# Patient Record
Sex: Male | Born: 1985 | Race: White | Hispanic: No | Marital: Single | Smoking: Current every day smoker
Health system: Southern US, Community
[De-identification: ages and names within clinical notes are randomized; demographics above are authoritative.]

## PROBLEM LIST (undated history)

## (undated) DIAGNOSIS — F32A Depression, unspecified: Secondary | ICD-10-CM

## (undated) DIAGNOSIS — F329 Major depressive disorder, single episode, unspecified: Secondary | ICD-10-CM

## (undated) DIAGNOSIS — B019 Varicella without complication: Secondary | ICD-10-CM

## (undated) DIAGNOSIS — F419 Anxiety disorder, unspecified: Secondary | ICD-10-CM

## (undated) HISTORY — DX: Varicella without complication: B01.9

## (undated) HISTORY — DX: Anxiety disorder, unspecified: F41.9

## (undated) HISTORY — DX: Major depressive disorder, single episode, unspecified: F32.9

## (undated) HISTORY — DX: Depression, unspecified: F32.A

## (undated) HISTORY — PX: OTHER SURGICAL HISTORY: SHX169

---

## 2013-02-27 ENCOUNTER — Ambulatory Visit: Payer: Self-pay | Admitting: Physician Assistant

## 2013-03-27 ENCOUNTER — Encounter: Payer: Self-pay | Admitting: Physician Assistant

## 2013-03-27 ENCOUNTER — Ambulatory Visit (INDEPENDENT_AMBULATORY_CARE_PROVIDER_SITE_OTHER): Payer: 59 | Admitting: Physician Assistant

## 2013-03-27 VITALS — BP 122/86 | HR 81 | Temp 98.0°F | Resp 16 | Ht 69.0 in | Wt 169.8 lb

## 2013-03-27 DIAGNOSIS — Z299 Encounter for prophylactic measures, unspecified: Secondary | ICD-10-CM | POA: Insufficient documentation

## 2013-03-27 DIAGNOSIS — F411 Generalized anxiety disorder: Secondary | ICD-10-CM | POA: Insufficient documentation

## 2013-03-27 DIAGNOSIS — Z0000 Encounter for general adult medical examination without abnormal findings: Secondary | ICD-10-CM

## 2013-03-27 DIAGNOSIS — F338 Other recurrent depressive disorders: Secondary | ICD-10-CM | POA: Insufficient documentation

## 2013-03-27 DIAGNOSIS — F39 Unspecified mood [affective] disorder: Secondary | ICD-10-CM

## 2013-03-27 LAB — CBC WITH DIFFERENTIAL/PLATELET
BASOS ABS: 0 10*3/uL (ref 0.0–0.1)
BASOS PCT: 0 % (ref 0–1)
EOS ABS: 0.1 10*3/uL (ref 0.0–0.7)
Eosinophils Relative: 2 % (ref 0–5)
HEMATOCRIT: 43.5 % (ref 39.0–52.0)
Hemoglobin: 15.3 g/dL (ref 13.0–17.0)
Lymphocytes Relative: 37 % (ref 12–46)
Lymphs Abs: 1.6 10*3/uL (ref 0.7–4.0)
MCH: 33.3 pg (ref 26.0–34.0)
MCHC: 35.2 g/dL (ref 30.0–36.0)
MCV: 94.8 fL (ref 78.0–100.0)
MONOS PCT: 15 % — AB (ref 3–12)
Monocytes Absolute: 0.6 10*3/uL (ref 0.1–1.0)
Neutro Abs: 2 10*3/uL (ref 1.7–7.7)
Neutrophils Relative %: 46 % (ref 43–77)
Platelets: 213 10*3/uL (ref 150–400)
RBC: 4.59 MIL/uL (ref 4.22–5.81)
RDW: 13.5 % (ref 11.5–15.5)
WBC: 4.2 10*3/uL (ref 4.0–10.5)

## 2013-03-27 MED ORDER — ESCITALOPRAM OXALATE 10 MG PO TABS: 10.0000 mg | ORAL_TABLET | Freq: Every day | ORAL | Status: DC

## 2013-03-27 MED ORDER — DIAZEPAM 2 MG PO TABS: 2.0000 mg | ORAL_TABLET | Freq: Two times a day (BID) | ORAL | Status: DC | PRN

## 2013-03-27 NOTE — Assessment & Plan Note (Signed)
Encouraged outdoor activities and exercise.  Will resume Lexapro for GAD. Follow-up in 1 month.

## 2013-03-27 NOTE — Progress Notes (Signed)
Pre visit review using our clinic review tool, if applicable. No additional management support is needed unless otherwise documented below in the visit note/SLS  

## 2013-03-27 NOTE — Assessment & Plan Note (Signed)
Medical history reviewed.  Patient UTD on health maintenance parameters.  Will obtain fasting labs today.

## 2013-03-27 NOTE — Progress Notes (Signed)
Patient presents to clinic today to establish care.  Acute Concerns: Wants to discuss medications for anxiety (see below).  Patient also requesting yearly STD screening.  Denies current concerns for STD. Patient is sexually active with one male partner, engaging in receptive and penetrative anal sex.  Endorses consistent condom use. Denies hx of STD.   Chronic Issues: Anxiety -- Patient endorses history of generalized anxiety.  Has been on Lexapro before, with good control of symptoms.  Stopped medication due to lack of insurance. Patient endorses continued symptoms.  Denies panic attacks.  Endorses seasonal affective disorder including symptpoms of depressed mood.  Denies suicidal thought or ideation.  Denies substance abuse.  Seasonal Affective Disorder -- endorses winter symptoms of depressed mood and anhedonia. Denies change in sleep or appetite.  Denies suicidal thought or ideation.  Would like to resume Lexapro for his generalized anxiety disorder.  Health Maintenance: Dental -- UTD Vision -- overude Immunizations -- Tetanus shot 2014.  Flu shot in November 2014.  Past Medical History  Diagnosis Date  . Chicken pox   . Anxiety   . Depression     Past Surgical History  Procedure Laterality Date  . Unremarkable.      No current outpatient prescriptions on file prior to visit.   No current facility-administered medications on file prior to visit.    Allergies  Allergen Reactions  . Skelaxin [Metaxalone] Swelling and Rash    Family History  Problem Relation Age of Onset  . Adopted: Yes  . Other      Adopted    History   Social History  . Marital Status: Single    Spouse Name: N/A    Number of Children: N/A  . Years of Education: N/A   Occupational History  . Not on file.   Social History Main Topics  . Smoking status: Current Every Day Smoker -- 0.25 packs/day for 10 years    Types: Cigarettes  . Smokeless tobacco: Never Used  . Alcohol Use: 7.5 oz/week    5 Glasses of wine, 5 Cans of beer, 3 Drinks containing 0.5 oz of alcohol per week  . Drug Use: No  . Sexual Activity: Yes    Birth Control/ Protection: Condom     Comment: men    Other Topics Concern  . Not on file   Social History Narrative  . No narrative on file   Review of Systems  Constitutional: Negative for fever and malaise/fatigue.  HENT: Negative for ear discharge, ear pain, hearing loss and tinnitus.   Eyes: Negative for blurred vision, double vision and photophobia.  Respiratory: Negative for cough and shortness of breath.   Cardiovascular: Negative for chest pain and palpitations.  Gastrointestinal: Negative for heartburn, nausea, vomiting, abdominal pain, diarrhea, constipation, blood in stool and melena.  Genitourinary: Negative for dysuria, urgency, frequency, hematuria and flank pain.       No erectile dysfunction.  Nocturia x 1  Neurological: Negative for dizziness, seizures, loss of consciousness and headaches.  Endo/Heme/Allergies: Negative for environmental allergies.  Psychiatric/Behavioral: Positive for depression. Negative for suicidal ideas, hallucinations and substance abuse. The patient is nervous/anxious. The patient does not have insomnia.     BP 122/86  Pulse 81  Temp(Src) 98 F (36.7 C) (Oral)  Resp 16  Ht 5\' 9"  (1.753 m)  Wt 169 lb 12 oz (76.998 kg)  BMI 25.06 kg/m2  SpO2 98%  Physical Exam  Constitutional: He is oriented to person, place, and time and well-developed, well-nourished, and  in no distress.  HENT:  Head: Normocephalic and atraumatic.  Right Ear: External ear normal.  Left Ear: External ear normal.  Nose: Nose normal.  Mouth/Throat: Oropharynx is clear and moist. No oropharyngeal exudate.  TM within normal limits bilaterally.  Eyes: Conjunctivae and EOM are normal. Pupils are equal, round, and reactive to light.  Neck: Neck supple. No thyromegaly present.  Cardiovascular: Normal rate, regular rhythm, normal heart sounds and  intact distal pulses.   Pulmonary/Chest: Effort normal and breath sounds normal. No respiratory distress. He has no wheezes. He has no rales. He exhibits no tenderness.  Abdominal: Soft. Bowel sounds are normal. He exhibits no distension and no mass. There is no tenderness. There is no rebound and no guarding. No hernia.  Genitourinary: Prostate normal, testes/scrotum normal and penis normal. No discharge found.  Musculoskeletal: Normal range of motion.  Lymphadenopathy:    He has no cervical adenopathy.       Right: No inguinal adenopathy present.       Left: No inguinal adenopathy present.  Neurological: He is alert and oriented to person, place, and time. No cranial nerve deficit.  Skin: Skin is warm and dry. No rash noted.  Psychiatric: Affect normal.    No results found for this or any previous visit (from the past 2160 hour(s)).  Assessment/Plan: No problem-specific assessment & plan notes found for this encounter.

## 2013-03-27 NOTE — Patient Instructions (Signed)
Please obtain labs.  I will call you with your results.  Please start Lexapro -- 1/2 tablet daily for 7 days.  Then increase to 1 tablet daily.  Will follow-up in 1 month.  Please read information below on smoking cessation options.  Please call or return to clinic sooner if you need anything.  Smoking Cessation Quitting smoking is important to your health and has many advantages. However, it is not always easy to quit since nicotine is a very addictive drug. Often times, people try 3 times or more before being able to quit. This document explains the best ways for you to prepare to quit smoking. Quitting takes hard work and a lot of effort, but you can do it. ADVANTAGES OF QUITTING SMOKING  You will live longer, feel better, and live better.  Your body will feel the impact of quitting smoking almost immediately.  Within 20 minutes, blood pressure decreases. Your pulse returns to its normal level.  After 8 hours, carbon monoxide levels in the blood return to normal. Your oxygen level increases.  After 24 hours, the chance of having a heart attack starts to decrease. Your breath, hair, and body stop smelling like smoke.  After 48 hours, damaged nerve endings begin to recover. Your sense of taste and smell improve.  After 72 hours, the body is virtually free of nicotine. Your bronchial tubes relax and breathing becomes easier.  After 2 to 12 weeks, lungs can hold more air. Exercise becomes easier and circulation improves.  The risk of having a heart attack, stroke, cancer, or lung disease is greatly reduced.  After 1 year, the risk of coronary heart disease is cut in half.  After 5 years, the risk of stroke falls to the same as a nonsmoker.  After 10 years, the risk of lung cancer is cut in half and the risk of other cancers decreases significantly.  After 15 years, the risk of coronary heart disease drops, usually to the level of a nonsmoker.  If you are pregnant, quitting smoking will  improve your chances of having a healthy baby.  The people you live with, especially any children, will be healthier.  You will have extra money to spend on things other than cigarettes. QUESTIONS TO THINK ABOUT BEFORE ATTEMPTING TO QUIT You may want to talk about your answers with your caregiver.  Why do you want to quit?  If you tried to quit in the past, what helped and what did not?  What will be the most difficult situations for you after you quit? How will you plan to handle them?  Who can help you through the tough times? Your family? Friends? A caregiver?  What pleasures do you get from smoking? What ways can you still get pleasure if you quit? Here are some questions to ask your caregiver:  How can you help me to be successful at quitting?  What medicine do you think would be best for me and how should I take it?  What should I do if I need more help?  What is smoking withdrawal like? How can I get information on withdrawal? GET READY  Set a quit date.  Change your environment by getting rid of all cigarettes, ashtrays, matches, and lighters in your home, car, or work. Do not let people smoke in your home.  Review your past attempts to quit. Think about what worked and what did not. GET SUPPORT AND ENCOURAGEMENT You have a better chance of being successful if you  have help. You can get support in many ways.  Tell your family, friends, and co-workers that you are going to quit and need their support. Ask them not to smoke around you.  Get individual, group, or telephone counseling and support. Programs are available at [employer] and health centers. Call your local health department for information about programs in your area.  Spiritual beliefs and practices may help some smokers quit.  Download a "quit meter" on your computer to keep track of quit statistics, such as how long you have gone without smoking, cigarettes not smoked, and money saved.  Get a  self-help book about quitting smoking and staying off of tobacco. LEARN NEW SKILLS AND BEHAVIORS  Distract yourself from urges to smoke. Talk to someone, go for a walk, or occupy your time with a task.  Change your normal routine. Take a different route to work. Drink tea instead of coffee. Eat breakfast in a different place.  Reduce your stress. Take a hot bath, exercise, or read a book.  Plan something enjoyable to do every day. Reward yourself for not smoking.  Explore interactive web-based programs that specialize in helping you quit. GET MEDICINE AND USE IT CORRECTLY Medicines can help you stop smoking and decrease the urge to smoke. Combining medicine with the above behavioral methods and support can greatly increase your chances of successfully quitting smoking.  Nicotine replacement therapy helps deliver nicotine to your body without the negative effects and risks of smoking. Nicotine replacement therapy includes nicotine gum, lozenges, inhalers, nasal sprays, and skin patches. Some may be available over-the-counter and others require a prescription.  Antidepressant medicine helps people abstain from smoking, but how this works is unknown. This medicine is available by prescription.  Nicotinic receptor partial agonist medicine simulates the effect of nicotine in your brain. This medicine is available by prescription. Ask your caregiver for advice about which medicines to use and how to use them based on your health history. Your caregiver will tell you what side effects to look out for if you choose to be on a medicine or therapy. Carefully read the information on the package. Do not use any other product containing nicotine while using a nicotine replacement product.  RELAPSE OR DIFFICULT SITUATIONS Most relapses occur within the first 3 months after quitting. Do not be discouraged if you start smoking again. Remember, most people try several times before finally quitting. You may have  symptoms of withdrawal because your body is used to nicotine. You may crave cigarettes, be irritable, feel very hungry, cough often, get headaches, or have difficulty concentrating. The withdrawal symptoms are only temporary. They are strongest when you first quit, but they will go away within 10 14 days. To reduce the chances of relapse, try to:  Avoid drinking alcohol. Drinking lowers your chances of successfully quitting.  Reduce the amount of caffeine you consume. Once you quit smoking, the amount of caffeine in your body increases and can give you symptoms, such as a rapid heartbeat, sweating, and anxiety.  Avoid smokers because they can make you want to smoke.  Do not let weight gain distract you. Many smokers will gain weight when they quit, usually less than 10 pounds. Eat a healthy diet and stay active. You can always lose the weight gained after you quit.  Find ways to improve your mood other than smoking. FOR MORE INFORMATION  www.smokefree.gov  Document Released: 02/10/2001 Document Revised: 08/18/2011 Document Reviewed: 05/28/2011 [facility] Patient Information 2014 [place], [place].

## 2013-03-27 NOTE — Assessment & Plan Note (Signed)
Will resume Lexapro -- 5 mg QD x 7 days, increasing to 10 mg QD thereafter.  Will also Rx Valium BID PRN until Lexapro reaches therapeutic dosage.  Will obtain BMP.  Return in 1 month or sooner if needed.

## 2013-03-27 NOTE — Assessment & Plan Note (Signed)
Will obtain STD screening labs.  Discussed increased risk of hepatitis A, syphilis and HIV among the MSM population. Patient is sexually active with one partner, endorsing consistent condom usage.

## 2013-03-28 LAB — COMPLETE METABOLIC PANEL WITH GFR
ALK PHOS: 82 U/L (ref 39–117)
ALT: 21 U/L (ref 0–53)
AST: 19 U/L (ref 0–37)
Albumin: 4.8 g/dL (ref 3.5–5.2)
BUN: 13 mg/dL (ref 6–23)
CO2: 29 mEq/L (ref 19–32)
CREATININE: 0.9 mg/dL (ref 0.50–1.35)
Calcium: 9.6 mg/dL (ref 8.4–10.5)
Chloride: 99 mEq/L (ref 96–112)
GFR, Est African American: >89 mL/min
GFR, Est Non African American: >89 mL/min
Glucose, Bld: 83 mg/dL (ref 70–99)
Potassium: 4 mEq/L (ref 3.5–5.3)
Sodium: 136 mEq/L (ref 135–145)
Total Bilirubin: 1 mg/dL (ref 0.3–1.2)
Total Protein: 7.7 g/dL (ref 6.0–8.3)

## 2013-03-28 LAB — URINALYSIS, ROUTINE W REFLEX MICROSCOPIC
Bilirubin Urine: NEGATIVE
Glucose, UA: NEGATIVE mg/dL
HGB URINE DIPSTICK: NEGATIVE
Ketones, ur: NEGATIVE mg/dL
NITRITE: NEGATIVE
PH: 7 (ref 5.0–8.0)
Protein, ur: NEGATIVE mg/dL
SPECIFIC GRAVITY, URINE: 1.021 (ref 1.005–1.030)
UROBILINOGEN UA: 1 mg/dL (ref 0.0–1.0)

## 2013-03-28 LAB — URINALYSIS, MICROSCOPIC ONLY
Bacteria, UA: NONE SEEN
CASTS: NONE SEEN
Crystals: NONE SEEN
Squamous Epithelial / LPF: NONE SEEN

## 2013-03-28 LAB — RPR

## 2013-03-28 LAB — TSH: TSH: 1.317 u[IU]/mL (ref 0.350–4.500)

## 2013-03-28 LAB — LIPID PANEL
CHOL/HDL RATIO: 2.4 ratio
Cholesterol: 149 mg/dL (ref 0–200)
HDL: 62 mg/dL (ref >39)
LDL Cholesterol: 68 mg/dL (ref 0–99)
Triglycerides: 96 mg/dL (ref <150)
VLDL: 19 mg/dL (ref 0–40)

## 2013-03-28 LAB — HIV ANTIBODY (ROUTINE TESTING W REFLEX): HIV: NONREACTIVE

## 2013-03-28 LAB — GC/CHLAMYDIA PROBE AMP, URINE
Chlamydia, Swab/Urine, PCR: NEGATIVE
GC Probe Amp, Urine: NEGATIVE

## 2013-03-28 LAB — HEPATITIS PANEL, ACUTE
HCV AB: NEGATIVE
HEP B C IGM: NONREACTIVE
Hep A IgM: NONREACTIVE
Hepatitis B Surface Ag: NEGATIVE

## 2013-03-31 ENCOUNTER — Telehealth: Payer: Self-pay | Admitting: Physician Assistant

## 2013-03-31 NOTE — Telephone Encounter (Signed)
PLEASE SEE RESULT NOTE.

## 2013-03-31 NOTE — Telephone Encounter (Signed)
Test results

## 2013-04-04 ENCOUNTER — Telehealth: Payer: Self-pay | Admitting: Physician Assistant

## 2013-04-04 NOTE — Telephone Encounter (Signed)
Relevant patient education assigned to patient using Emmi. ° °

## 2013-04-18 ENCOUNTER — Encounter (HOSPITAL_COMMUNITY): Payer: Self-pay | Admitting: Emergency Medicine

## 2013-04-18 ENCOUNTER — Emergency Department (INDEPENDENT_AMBULATORY_CARE_PROVIDER_SITE_OTHER): Payer: 59

## 2013-04-18 ENCOUNTER — Emergency Department (HOSPITAL_COMMUNITY)
Admission: EM | Admit: 2013-04-18 | Discharge: 2013-04-18 | Disposition: A | Discharge status: Home / Self Care | Payer: 59 | Source: Home / Self Care | Attending: Family Medicine | Admitting: Family Medicine

## 2013-04-18 DIAGNOSIS — S022XXA Fracture of nasal bones, initial encounter for closed fracture: Secondary | ICD-10-CM

## 2013-04-18 MED ORDER — HYDROCODONE-ACETAMINOPHEN 5-325 MG PO TABS: 1.0000 | ORAL_TABLET | Freq: Four times a day (QID) | ORAL | Status: DC | PRN

## 2013-04-18 NOTE — ED Provider Notes (Signed)
CSN: [id number]     Arrival date & time 04/18/13  1336 History   First MD Initiated Contact with Patient 04/18/13 1422     No chief complaint on file.    (Consider location/radiation/quality/duration/timing/severity/associated sxs/prior Treatment) HPI Comments: Patient states he was coming from walking his dog last night and slipped on some icy steps. Struck his nose on one of the steps. Denies dental injury, additional facial injury or LOC. Had brief period of bilateral epistaxis that resolved with application of directed pressure. Has abrasion to bridge of nose. Last tetanus booster in Nov. 2014. Is concerned that his nose might be broken. No previous nasal fractures.  The history is provided by the patient.    Past Medical History  Diagnosis Date  . Chicken pox   . Anxiety   . Depression    Past Surgical History  Procedure Laterality Date  . Unremarkable.     Family History  Problem Relation Age of Onset  . Adopted: Yes  . Other      Adopted   History  Substance Use Topics  . Smoking status: Current Every Day Smoker -- 0.25 packs/day for 10 years    Types: Cigarettes  . Smokeless tobacco: Never Used  . Alcohol Use: 7.5 oz/week    5 Glasses of wine, 5 Cans of beer, 3 Drinks containing 0.5 oz of alcohol per week    Review of Systems  All other systems reviewed and are negative.      Allergies  Skelaxin  Home Medications   Current Outpatient Rx  Name  Route  Sig  Dispense  Refill  . diazepam (VALIUM) 2 MG tablet   Oral   Take 1 tablet (2 mg total) by mouth every 12 (twelve) hours as needed for anxiety.   30 tablet   0   . escitalopram (LEXAPRO) 10 MG tablet   Oral   Take 1 tablet (10 mg total) by mouth daily.   30 tablet   1    There were no vitals taken for this visit. Physical Exam  Nursing note and vitals reviewed. Constitutional: He is oriented to person, place, and time. He appears well-developed and well-nourished. No distress.  HENT:  Head:  Normocephalic. Head is with abrasion and with contusion.    Right Ear: External ear normal.  Left Ear: External ear normal.  Nose: Sinus tenderness present. No nasal deformity, septal deviation or nasal septal hematoma. No epistaxis.  No foreign bodies.  Mouth/Throat: Oropharynx is clear and moist.  +deep abrasion with STS at bridge of nose  Cardiovascular: Normal rate.   Pulmonary/Chest: Effort normal.  Musculoskeletal: Normal range of motion.  Neurological: He is alert and oriented to person, place, and time.  Skin: Skin is warm and dry.  Psychiatric: He has a normal mood and affect. His behavior is normal.    ED Course  Procedures (including critical care time) Labs Review Labs Reviewed - No data to display Imaging Review No results found.    MDM   Final diagnoses:  None  Nasal fracture and fx at tip of anterior maxillary spine. Ice and sleep with head elevated to reduce swelling and follow up with [facility] in 5-7 days. Tylenol or ibuprofen for pain.  Jess BartersJennifer Lee CrestonPresson, [place] 04/18/13 [phone]

## 2013-04-18 NOTE — ED Notes (Signed)
Patient reports out walking dog last night, slipped on ice, striking face on steps.  Abrasion to nose.

## 2013-04-18 NOTE — Discharge Instructions (Signed)
Facial Fracture A facial fracture is a break in one of the bones of your face. HOME CARE INSTRUCTIONS   Protect the injured part of your face until it is healed.  Do not participate in activities which give chance for re-injury until your doctor approves.  Gently wash and dry your face.  Wear head and facial protection while riding a bicycle, motorcycle, or snowmobile. SEEK MEDICAL CARE IF:   An oral temperature above 102 F (38.9 C) develops.  You have severe headaches or notice changes in your vision.  You have new numbness or tingling in your face.  You develop nausea (feeling sick to your stomach), vomiting or a stiff neck. SEEK IMMEDIATE MEDICAL CARE IF:   You develop difficulty seeing or experience double vision.  You become dizzy, lightheaded, or faint.  You develop trouble speaking, breathing, or swallowing.  You have a watery discharge from your nose or ear. MAKE SURE YOU:   Understand these instructions.  Will watch your condition.  Will get help right away if you are not doing well or get worse. Document Released: 02/16/2005 Document Revised: 05/11/2011 Document Reviewed: 10/06/2007 [facility] Patient Information 2014 [place], [place].  You have broken your nose. Please sleep with head elevated to reduce swelling and apply ice 2-3 times a day. Tylenol or ibuprofen as needed for pain and contact [facility] at number listed on your discharge paperwork to arrange to be seen in follow up in 5-7 days.

## 2013-04-19 NOTE — ED Provider Notes (Signed)
Medical screening examination/treatment/procedure(s) were performed by resident physician or non-physician practitioner and as supervising physician I was immediately available for consultation/collaboration.   Barkley BrunsKINDL,Jayston Trevino DOUGLAS MD.   Linna HoffJames D Cassady Stanczak, MD 04/19/13 2006

## 2013-05-03 ENCOUNTER — Ambulatory Visit: Payer: 59 | Admitting: Physician Assistant

## 2013-05-04 ENCOUNTER — Ambulatory Visit (INDEPENDENT_AMBULATORY_CARE_PROVIDER_SITE_OTHER): Payer: 59 | Admitting: Physician Assistant

## 2013-05-04 ENCOUNTER — Encounter: Payer: Self-pay | Admitting: Physician Assistant

## 2013-05-04 VITALS — BP 116/84 | HR 77 | Temp 98.1°F | Resp 16 | Ht 69.0 in | Wt 171.5 lb

## 2013-05-04 DIAGNOSIS — F411 Generalized anxiety disorder: Secondary | ICD-10-CM

## 2013-05-04 DIAGNOSIS — F39 Unspecified mood [affective] disorder: Secondary | ICD-10-CM

## 2013-05-04 DIAGNOSIS — F338 Other recurrent depressive disorders: Secondary | ICD-10-CM

## 2013-05-04 MED ORDER — DIAZEPAM 2 MG PO TABS
2.0000 mg | ORAL_TABLET | Freq: Two times a day (BID) | ORAL | Status: DC | PRN
Start: 2013-05-04 — End: 2013-07-26

## 2013-05-04 MED ORDER — ESCITALOPRAM OXALATE 10 MG PO TABS: 10.0000 mg | ORAL_TABLET | Freq: Every day | ORAL | Status: DC

## 2013-05-04 NOTE — Progress Notes (Signed)
Pre visit review using our clinic review tool, if applicable. No additional management support is needed unless otherwise documented below in the visit note/SLS  

## 2013-05-04 NOTE — Assessment & Plan Note (Signed)
Well controlled.  No change to current regimen. Denies side effects of medication.  Refill medications.  F/U in 6 months.

## 2013-05-04 NOTE — Progress Notes (Signed)
Patient presents to clinic today c/o follow-up anxiety and seasonal affective disorder.  Endorses relief of symptoms with Lexapro. Valium used once a week for breatkthrough anxiety.  No SI/HI.  Feels good on current regimen.   Past Medical History  Diagnosis Date  . Chicken pox   . Anxiety   . Depression     No current outpatient prescriptions on file prior to visit.   No current facility-administered medications on file prior to visit.    Allergies  Allergen Reactions  . Skelaxin [Metaxalone] Swelling and Rash    Family History  Problem Relation Age of Onset  . Adopted: Yes  . Other      Adopted    History   Social History  . Marital Status: Single    Spouse Name: N/A    Number of Children: N/A  . Years of Education: N/A   Social History Main Topics  . Smoking status: Current Every Day Smoker -- 0.25 packs/day for 10 years    Types: Cigarettes  . Smokeless tobacco: Never Used  . Alcohol Use: 7.5 oz/week    5 Glasses of wine, 5 Cans of beer, 3 Drinks containing 0.5 oz of alcohol per week  . Drug Use: No  . Sexual Activity: Yes    Birth Control/ Protection: Condom     Comment: men    Other Topics Concern  . None   Social History Narrative  . None    Review of Systems - See HPI.  All other ROS are negative.  BP 116/84  Pulse 77  Temp(Src) 98.1 F (36.7 C) (Oral)  Resp 16  Ht $R'5\' 9"'HX$  (1.753 m)  Wt 171 lb 8 oz (77.792 kg)  BMI 25.31 kg/m2  SpO2 98%  Physical Exam  Vitals reviewed. Constitutional: He is oriented to person, place, and time and well-developed, well-nourished, and in no distress.  HENT:  Head: Normocephalic and atraumatic.  Eyes: Conjunctivae are normal.  Neck: Neck supple.  Cardiovascular: Normal rate, regular rhythm, normal heart sounds and intact distal pulses.   Pulmonary/Chest: Effort normal and breath sounds normal. No respiratory distress. He has no wheezes. He has no rales. He exhibits no tenderness.  Neurological: He is alert  and oriented to person, place, and time.  Skin: Skin is warm and dry.  Psychiatric: Mood, memory, affect and judgment normal.    Recent Results (from the past 2160 hour(s))  CBC WITH DIFFERENTIAL     Status: Abnormal   Collection Time    03/27/13 12:08 PM      Result Value Ref Range   WBC 4.2  4.0 - 10.5 K/uL   RBC 4.59  4.22 - 5.81 MIL/uL   Hemoglobin 15.3  13.0 - 17.0 g/dL   HCT 43.5  39.0 - 52.0 %   MCV 94.8  78.0 - 100.0 fL   MCH 33.3  26.0 - 34.0 pg   MCHC 35.2  30.0 - 36.0 g/dL   RDW 13.5  11.5 - 15.5 %   Platelets 213  150 - 400 K/uL   Neutrophils Relative % 46  43 - 77 %   Neutro Abs 2.0  1.7 - 7.7 K/uL   Lymphocytes Relative 37  12 - 46 %   Lymphs Abs 1.6  0.7 - 4.0 K/uL   Monocytes Relative 15 (*) 3 - 12 %   Monocytes Absolute 0.6  0.1 - 1.0 K/uL   Eosinophils Relative 2  0 - 5 %   Eosinophils Absolute 0.1  0.0 -  0.7 K/uL   Basophils Relative 0  0 - 1 %   Basophils Absolute 0.0  0.0 - 0.1 K/uL   Smear Review Criteria for review not met    COMPLETE METABOLIC PANEL WITH GFR     Status: None   Collection Time    03/27/13 12:08 PM      Result Value Ref Range   Sodium 136  135 - 145 mEq/L   Potassium 4.0  3.5 - 5.3 mEq/L   Chloride 99  96 - 112 mEq/L   CO2 29  19 - 32 mEq/L   Glucose, Bld 83  70 - 99 mg/dL   BUN 13  6 - 23 mg/dL   Creat 0.90  0.50 - 1.35 mg/dL   Total Bilirubin 1.0  0.3 - 1.2 mg/dL   Alkaline Phosphatase 82  39 - 117 U/L   AST 19  0 - 37 U/L   ALT 21  0 - 53 U/L   Total Protein 7.7  6.0 - 8.3 g/dL   Albumin 4.8  3.5 - 5.2 g/dL   Calcium 9.6  8.4 - 10.5 mg/dL   GFR, Est African American >89     GFR, Est Non African American >89     Comment:       The estimated GFR is a calculation valid for adults (>=75 years old)     that uses the CKD-EPI algorithm to adjust for age and sex. It is       not to be used for children, pregnant women, hospitalized patients,        patients on dialysis, or with rapidly changing kidney function.     According to  the NKDEP, eGFR >89 is normal, 60-89 shows mild     impairment, 30-59 shows moderate impairment, 15-29 shows severe     impairment and <15 is ESRD.        TSH     Status: None   Collection Time    03/27/13 12:08 PM      Result Value Ref Range   TSH 1.317  0.350 - 4.500 uIU/mL  URINALYSIS, ROUTINE W REFLEX MICROSCOPIC     Status: Abnormal   Collection Time    03/27/13 12:08 PM      Result Value Ref Range   Color, Urine YELLOW  YELLOW   APPearance CLEAR  CLEAR   Specific Gravity, Urine 1.021  1.005 - 1.030   pH 7.0  5.0 - 8.0   Glucose, UA NEG  NEG mg/dL   Bilirubin Urine NEG  NEG   Ketones, ur NEG  NEG mg/dL   Hgb urine dipstick NEG  NEG   Protein, ur NEG  NEG mg/dL   Urobilinogen, UA 1  0.0 - 1.0 mg/dL   Nitrite NEG  NEG   Leukocytes, UA TRACE (*) NEG  LIPID PANEL     Status: None   Collection Time    03/27/13 12:08 PM      Result Value Ref Range   Cholesterol 149  0 - 200 mg/dL   Comment: ATP III Classification:           < 200        mg/dL        Desirable          200 - 239     mg/dL        Borderline High          >= 240  mg/dL        High         Triglycerides 96  <150 mg/dL   HDL 62  >55 mg/dL   Total CHOL/HDL Ratio 2.4     VLDL 19  0 - 40 mg/dL   LDL Cholesterol 68  0 - 99 mg/dL   Comment:       Total Cholesterol/HDL Ratio:CHD Risk                            Coronary Heart Disease Risk Table                                            Men       Women              1/2 Average Risk              3.4        3.3                  Average Risk              5.0        4.4               2X Average Risk              9.6        7.1               3X Average Risk             23.4       11.0     Use the calculated Patient Ratio above and the CHD Risk table      to determine the patient's CHD Risk.     ATP III Classification (LDL):           < 100        mg/dL         Optimal          100 - 129     mg/dL         Near or Above Optimal          130 - 159     mg/dL          Borderline High          160 - 189     mg/dL         High           > 190        mg/dL         Very High        HIV ANTIBODY (ROUTINE TESTING)     Status: None   Collection Time    03/27/13 12:08 PM      Result Value Ref Range   HIV NON REACTIVE  NON REACTIVE  RPR     Status: None   Collection Time    03/27/13 12:08 PM      Result Value Ref Range   RPR NON REAC  NON REAC  GC/CHLAMYDIA PROBE AMP, URINE     Status: None   Collection Time    03/27/13 12:08 PM      Result Value Ref Range   Chlamydia, Swab/Urine, PCR NEGATIVE  NEGATIVE  Comment:                          **Normal Reference Range: Negative**                  Assay performed using the Gen-Probe APTIMA COMBO2 (R) Assay.         GC Probe Amp, Urine NEGATIVE  NEGATIVE   Comment:                          **Normal Reference Range: Negative**                  Assay performed using the Gen-Probe APTIMA COMBO2 (R) Assay.        HEPATITIS PANEL, ACUTE     Status: None   Collection Time    03/27/13 12:08 PM      Result Value Ref Range   Hepatitis B Surface Ag NEGATIVE  NEGATIVE   HCV Ab NEGATIVE  NEGATIVE   Hep B C IgM NON REACTIVE  NON REACTIVE   Comment: High levels of Hepatitis B Core IgM antibody are detectable     during the acute stage of Hepatitis B. This antibody is used     to differentiate current from past HBV infection.         Hep A IgM NON REACTIVE  NON REACTIVE  URINALYSIS, MICROSCOPIC ONLY     Status: None   Collection Time    03/27/13 12:08 PM      Result Value Ref Range   Squamous Epithelial / LPF NONE SEEN  RARE   Crystals NONE SEEN  NONE SEEN   Casts NONE SEEN  NONE SEEN   WBC, UA 0-2  <3 WBC/hpf   RBC / HPF 0-2  <3 RBC/hpf   Bacteria, UA NONE SEEN  RARE    Assessment/Plan: Generalized anxiety disorder Well controlled.  No change to current regimen. Denies side effects of medication.  Refill medications.  F/U in 6 months.  Seasonal affective disorder Well controlled.  No change to  current regimen. Denies side effects of medication. Denies SI/HI. Refill medications.  F/U in 6 months.

## 2013-05-04 NOTE — Patient Instructions (Signed)
Please continue medications as prescribed.  Follow-up in 6 months.  Return sooner if needed.   Seasonal Affective Disorder  A seasonal affective disorder is a depressive reaction. It is when you feel emotionally down, which seems to come at specific times of the year. The most common time of year for this is winter. Otherwise, it behaves like a plain depression. As with other depressive disorders, there are:  Crying episodes.  Headaches.  Irritability.  Loss of energy. DIAGNOSIS  The diagnosis of this problem is usually made by the history (what has been going on). A physical exam may be done to make sure there is no other cause of your depression. TREATMENT  The treatment of seasonal affective disorders has been found to be helped immensely by photo-therapy. This means a person sits or lies for several hours per day in front of or under bright lights. The symptoms (problems) of depression respond rapidly, usually over a couple days. HOME CARE INSTRUCTIONS   Follow your caregiver's instructions for light therapy.  You must be awake during the light therapy.  If you do not respond or you feel you are getting worse, see your caregiver. Document Released: 11/11/2000 Document Revised: 05/11/2011 Document Reviewed: 06/04/2005 [facility] Patient Information 2014 [employer], [place].

## 2013-05-04 NOTE — Assessment & Plan Note (Signed)
Well controlled.  No change to current regimen. Denies side effects of medication. Denies SI/HI. Refill medications.  F/U in 6 months.

## 2013-05-05 ENCOUNTER — Telehealth: Payer: Self-pay | Admitting: Physician Assistant

## 2013-05-05 NOTE — Telephone Encounter (Signed)
Relevant patient education assigned to patient using Emmi. ° °

## 2013-05-10 ENCOUNTER — Ambulatory Visit: Payer: 59 | Admitting: Physician Assistant

## 2013-07-25 ENCOUNTER — Telehealth: Payer: Self-pay | Admitting: *Deleted

## 2013-07-25 DIAGNOSIS — F411 Generalized anxiety disorder: Secondary | ICD-10-CM

## 2013-07-25 NOTE — Telephone Encounter (Signed)
Received message from pt that he recently lost his job. Wanted to know if he could get additional refill of lexapro as he is not sure how long his insurance will be in effect. Advised pt he still has refill on file at pharmacy and to contact them for refill now to see if it will go through under the insurance. If not, pt states he has [place] card that he can use to purchase medication. Pt also requests refill of diazepam. Last rx 05/04/13, #30.  Please advise.

## 2013-07-26 MED ORDER — DIAZEPAM 2 MG PO TABS: 2.0000 mg | ORAL_TABLET | Freq: Two times a day (BID) | ORAL | Status: DC | PRN

## 2013-07-26 MED ORDER — ESCITALOPRAM OXALATE 10 MG PO TABS: 10.0000 mg | ORAL_TABLET | Freq: Every day | ORAL | Status: DC

## 2013-07-26 NOTE — Telephone Encounter (Signed)
Rx printed, Rx request faxed to pharmacy/[facility]

## 2013-07-26 NOTE — Telephone Encounter (Signed)
Medications refilled.  Lexapro is a 90-day supply. Valium will be phoned in to patient's pharmacy.

## 2013-11-15 ENCOUNTER — Ambulatory Visit: Payer: 59 | Admitting: Physician Assistant

## 2014-07-14 ENCOUNTER — Other Ambulatory Visit: Payer: Self-pay | Admitting: Physician Assistant

## 2014-07-14 MED ORDER — AMOXICILLIN-POT CLAVULANATE 875-125 MG PO TABS: 1.0000 | ORAL_TABLET | Freq: Two times a day (BID) | ORAL | Status: DC

## 2014-07-16 ENCOUNTER — Ambulatory Visit (INDEPENDENT_AMBULATORY_CARE_PROVIDER_SITE_OTHER): Payer: BLUE CROSS/BLUE SHIELD | Admitting: Physician Assistant

## 2014-07-16 ENCOUNTER — Encounter: Payer: Self-pay | Admitting: Physician Assistant

## 2014-07-16 VITALS — BP 122/88 | HR 98 | Temp 98.6°F | Ht 69.0 in | Wt 210.0 lb

## 2014-07-16 DIAGNOSIS — B9689 Other specified bacterial agents as the cause of diseases classified elsewhere: Secondary | ICD-10-CM

## 2014-07-16 DIAGNOSIS — J019 Acute sinusitis, unspecified: Secondary | ICD-10-CM

## 2014-07-16 NOTE — Patient Instructions (Addendum)
Please take antibiotic as directed.  Increase fluid intake.  Use Saline nasal spray.  Take a daily multivitamin. Use Mucinex to help with congestion.  Place a humidifier in the bedroom.  Please call or return clinic if symptoms are not improving.  Sinusitis Sinusitis is redness, soreness, and swelling (inflammation) of the paranasal sinuses. Paranasal sinuses are air pockets within the bones of your face (beneath the eyes, the middle of the forehead, or above the eyes). In healthy paranasal sinuses, mucus is able to drain out, and air is able to circulate through them by way of your nose. However, when your paranasal sinuses are inflamed, mucus and air can become trapped. This can allow bacteria and other germs to grow and cause infection. Sinusitis can develop quickly and last only a short time (acute) or continue over a long period (chronic). Sinusitis that lasts for more than 12 weeks is considered chronic.  CAUSES  Causes of sinusitis include:  Allergies.  Structural abnormalities, such as displacement of the cartilage that separates your nostrils (deviated septum), which can decrease the air flow through your nose and sinuses and affect sinus drainage.  Functional abnormalities, such as when the small hairs (cilia) that line your sinuses and help remove mucus do not work properly or are not present. SYMPTOMS  Symptoms of acute and chronic sinusitis are the same. The primary symptoms are pain and pressure around the affected sinuses. Other symptoms include:  Upper toothache.  Earache.  Headache.  Bad breath.  Decreased sense of smell and taste.  A cough, which worsens when you are lying flat.  Fatigue.  Fever.  Thick drainage from your nose, which often is green and may contain pus (purulent).  Swelling and warmth over the affected sinuses. DIAGNOSIS  Your caregiver will perform a physical exam. During the exam, your caregiver may:  Look in your nose for signs of abnormal  growths in your nostrils (nasal polyps).  Tap over the affected sinus to check for signs of infection.  View the inside of your sinuses (endoscopy) with a special imaging device with a light attached (endoscope), which is inserted into your sinuses. If your caregiver suspects that you have chronic sinusitis, one or more of the following tests may be recommended:  Allergy tests.  Nasal culture A sample of mucus is taken from your nose and sent to a lab and screened for bacteria.  Nasal cytology A sample of mucus is taken from your nose and examined by your caregiver to determine if your sinusitis is related to an allergy. TREATMENT  Most cases of acute sinusitis are related to a viral infection and will resolve on their own within 10 days. Sometimes medicines are prescribed to help relieve symptoms (pain medicine, decongestants, nasal steroid sprays, or saline sprays).  However, for sinusitis related to a bacterial infection, your caregiver will prescribe antibiotic medicines. These are medicines that will help kill the bacteria causing the infection.  Rarely, sinusitis is caused by a fungal infection. In theses cases, your caregiver will prescribe antifungal medicine. For some cases of chronic sinusitis, surgery is needed. Generally, these are cases in which sinusitis recurs more than 3 times per year, despite other treatments. HOME CARE INSTRUCTIONS   Drink plenty of water. Water helps thin the mucus so your sinuses can drain more easily.  Use a humidifier.  Inhale steam 3 to 4 times a day (for example, sit in the bathroom with the shower running).  Apply a warm, moist washcloth to your face  3 to 4 times a day, or as directed by your caregiver.  Use saline nasal sprays to help moisten and clean your sinuses.  Take over-the-counter or prescription medicines for pain, discomfort, or fever only as directed by your caregiver. SEEK IMMEDIATE MEDICAL CARE IF:  You have increasing pain or  severe headaches.  You have nausea, vomiting, or drowsiness.  You have swelling around your face.  You have vision problems.  You have a stiff neck.  You have difficulty breathing. MAKE SURE YOU:   Understand these instructions.  Will watch your condition.  Will get help right away if you are not doing well or get worse. Document Released: 02/16/2005 Document Revised: 05/11/2011 Document Reviewed: 03/03/2011 [employer] Patient Information 2014 [employer].   

## 2014-07-16 NOTE — Progress Notes (Signed)
Pre visit review using our clinic review tool, if applicable. No additional management support is needed unless otherwise documented below in the visit note. 

## 2014-07-16 NOTE — Progress Notes (Signed)
   Patient presents to clinic today c/o 5 days of worsening sore throat, sinus pressure, sinus pain, productive cough, tachycardia and fatigue. Patient was started on Augmentin 2 days ago through [employer]. Notes some improvement in symptoms already.  Needed re-evaluation in office for employer.  Past Medical History  Diagnosis Date  . Chicken pox   . Anxiety   . Depression     Current Outpatient Prescriptions on File Prior to Visit  Medication Sig Dispense Refill  . amoxicillin-clavulanate (AUGMENTIN) 875-125 MG per tablet Take 1 tablet by mouth 2 (two) times daily. 20 tablet 0  . diazepam (VALIUM) 2 MG tablet Take 1 tablet (2 mg total) by mouth every 12 (twelve) hours as needed for anxiety. 30 tablet 0  . escitalopram (LEXAPRO) 10 MG tablet Take 1 tablet (10 mg total) by mouth daily. 90 tablet 1   No current facility-administered medications on file prior to visit.    Allergies  Allergen Reactions  . Skelaxin [Metaxalone] Swelling and Rash    Family History  Problem Relation Age of Onset  . Adopted: Yes  . Other      Adopted    History   Social History  . Marital Status: Single    Spouse Name: N/A  . Number of Children: N/A  . Years of Education: N/A   Social History Main Topics  . Smoking status: Current Every Day Smoker -- 0.25 packs/day for 10 years    Types: Cigarettes  . Smokeless tobacco: Never Used  . Alcohol Use: 7.5 oz/week    5 Glasses of wine, 5 Cans of beer, 3 Standard drinks or equivalent per week  . Drug Use: No  . Sexual Activity: Yes    Birth Control/ Protection: Condom     Comment: men    Other Topics Concern  . None   Social History Narrative   Review of Systems - See HPI.  All other ROS are negative.  BP 122/88 mmHg  Pulse 98  Temp(Src) 98.6 F (37 C) (Oral)  Ht 5\' 9"  (1.753 m)  Wt 210 lb (95.255 kg)  BMI 31.00 kg/m2  SpO2 98%  Physical Exam  Constitutional: He is oriented to person, place, and time and well-developed,  well-nourished, and in no distress.  HENT:  Head: Normocephalic and atraumatic.  Right Ear: Tympanic membrane, external ear and ear canal normal.  Left Ear: Tympanic membrane, external ear and ear canal normal.  Nose: Right sinus exhibits maxillary sinus tenderness and frontal sinus tenderness. Left sinus exhibits maxillary sinus tenderness and frontal sinus tenderness.  Mouth/Throat: Uvula is midline and mucous membranes are normal.  Eyes: Conjunctivae are normal.  Cardiovascular: Normal rate, regular rhythm, normal heart sounds and intact distal pulses.   Pulmonary/Chest: Effort normal and breath sounds normal. No respiratory distress. He has no wheezes. He has no rales. He exhibits no tenderness.  Neurological: He is alert and oriented to person, place, and time.  Skin: Skin is warm and dry. No rash noted.  Psychiatric: Affect normal.  Vitals reviewed.    Assessment/Plan: Acute bacterial sinusitis Rx Augmentin -- finish course.  Increase fluids.  Rest.  Saline nasal spray.  Probiotic.  Mucinex as directed.  Humidifier in bedroom.  Call or return to clinic if symptoms are not improving.

## 2014-07-16 NOTE — Assessment & Plan Note (Signed)
Rx Augmentin -- finish course.  Increase fluids.  Rest.  Saline nasal spray.  Probiotic.  Mucinex as directed.  Humidifier in bedroom.  Call or return to clinic if symptoms are not improving.

## 2014-09-11 IMAGING — CR DG NASAL BONES 3+V
2 series · 2 of 2 positions shown · non-contrast
Comparison: None.

CLINICAL DATA: Facial trauma secondary to a fall.

EXAM:
NASAL BONES - 3+ VIEW

[view not recorded (1 of 2)]
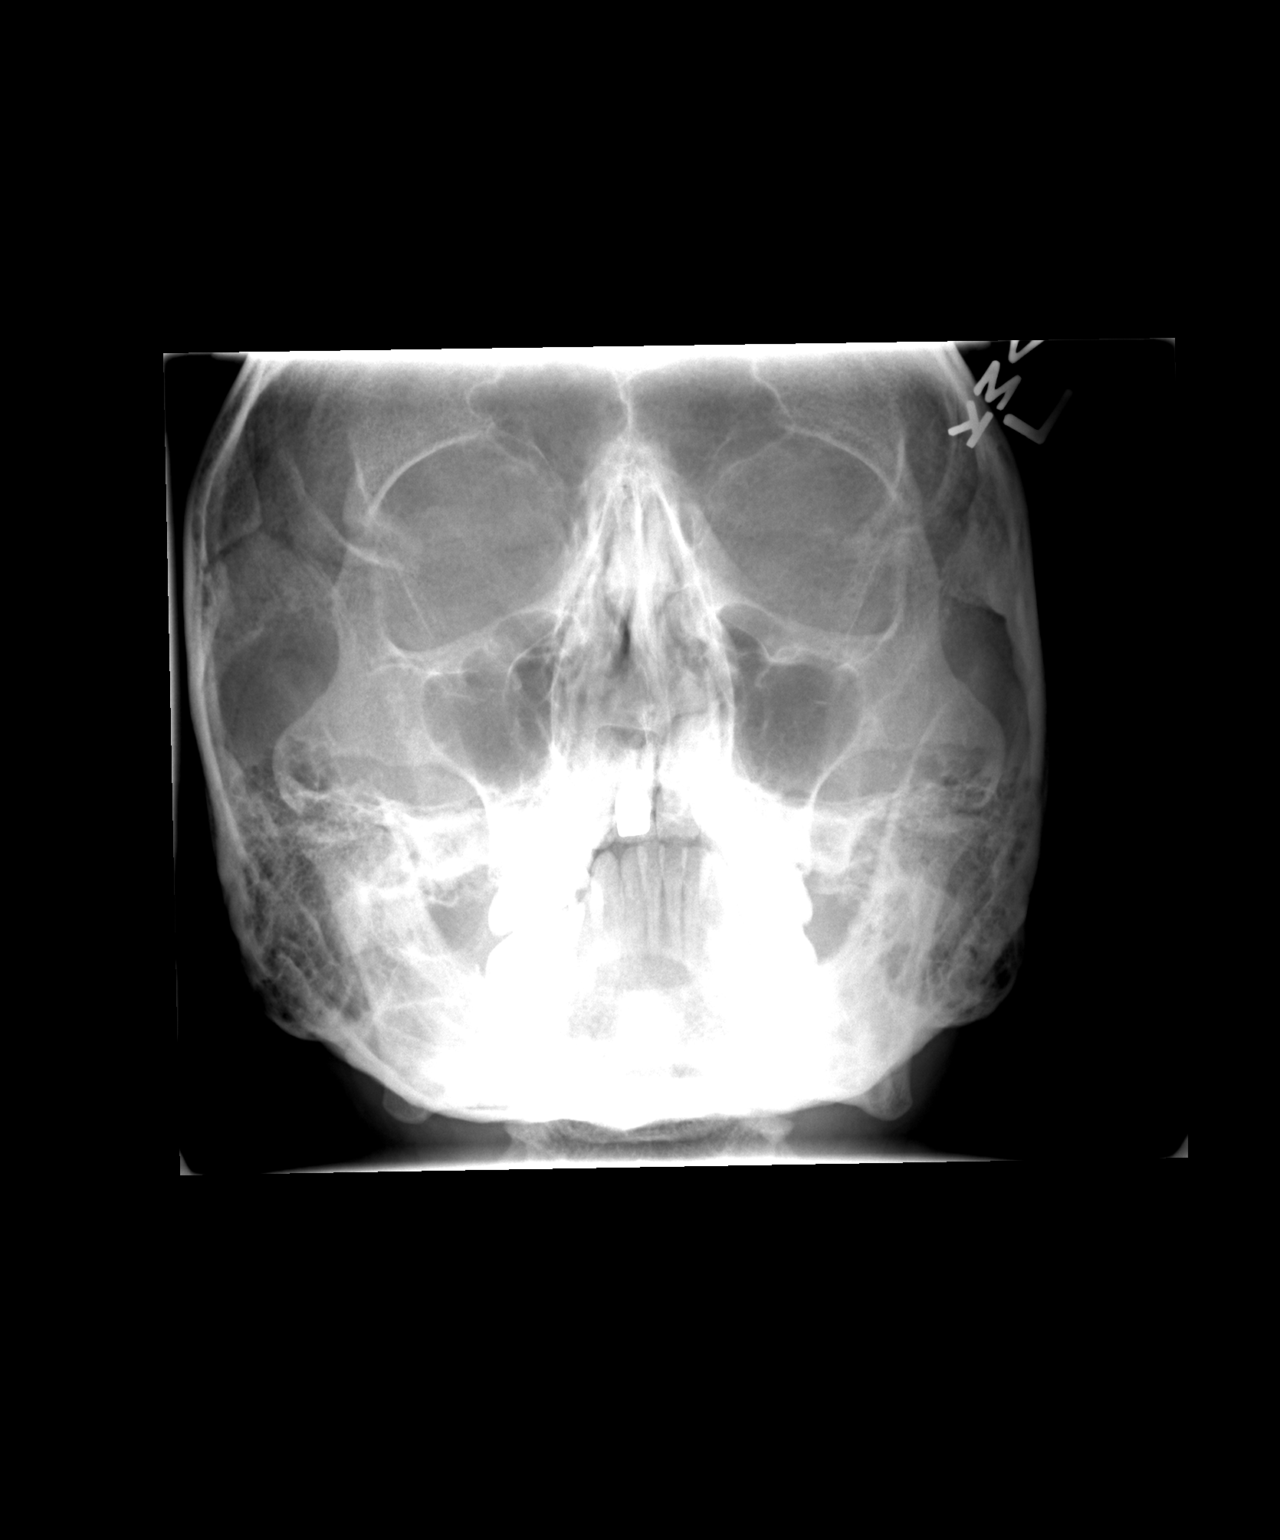

[view not recorded (2 of 2)]
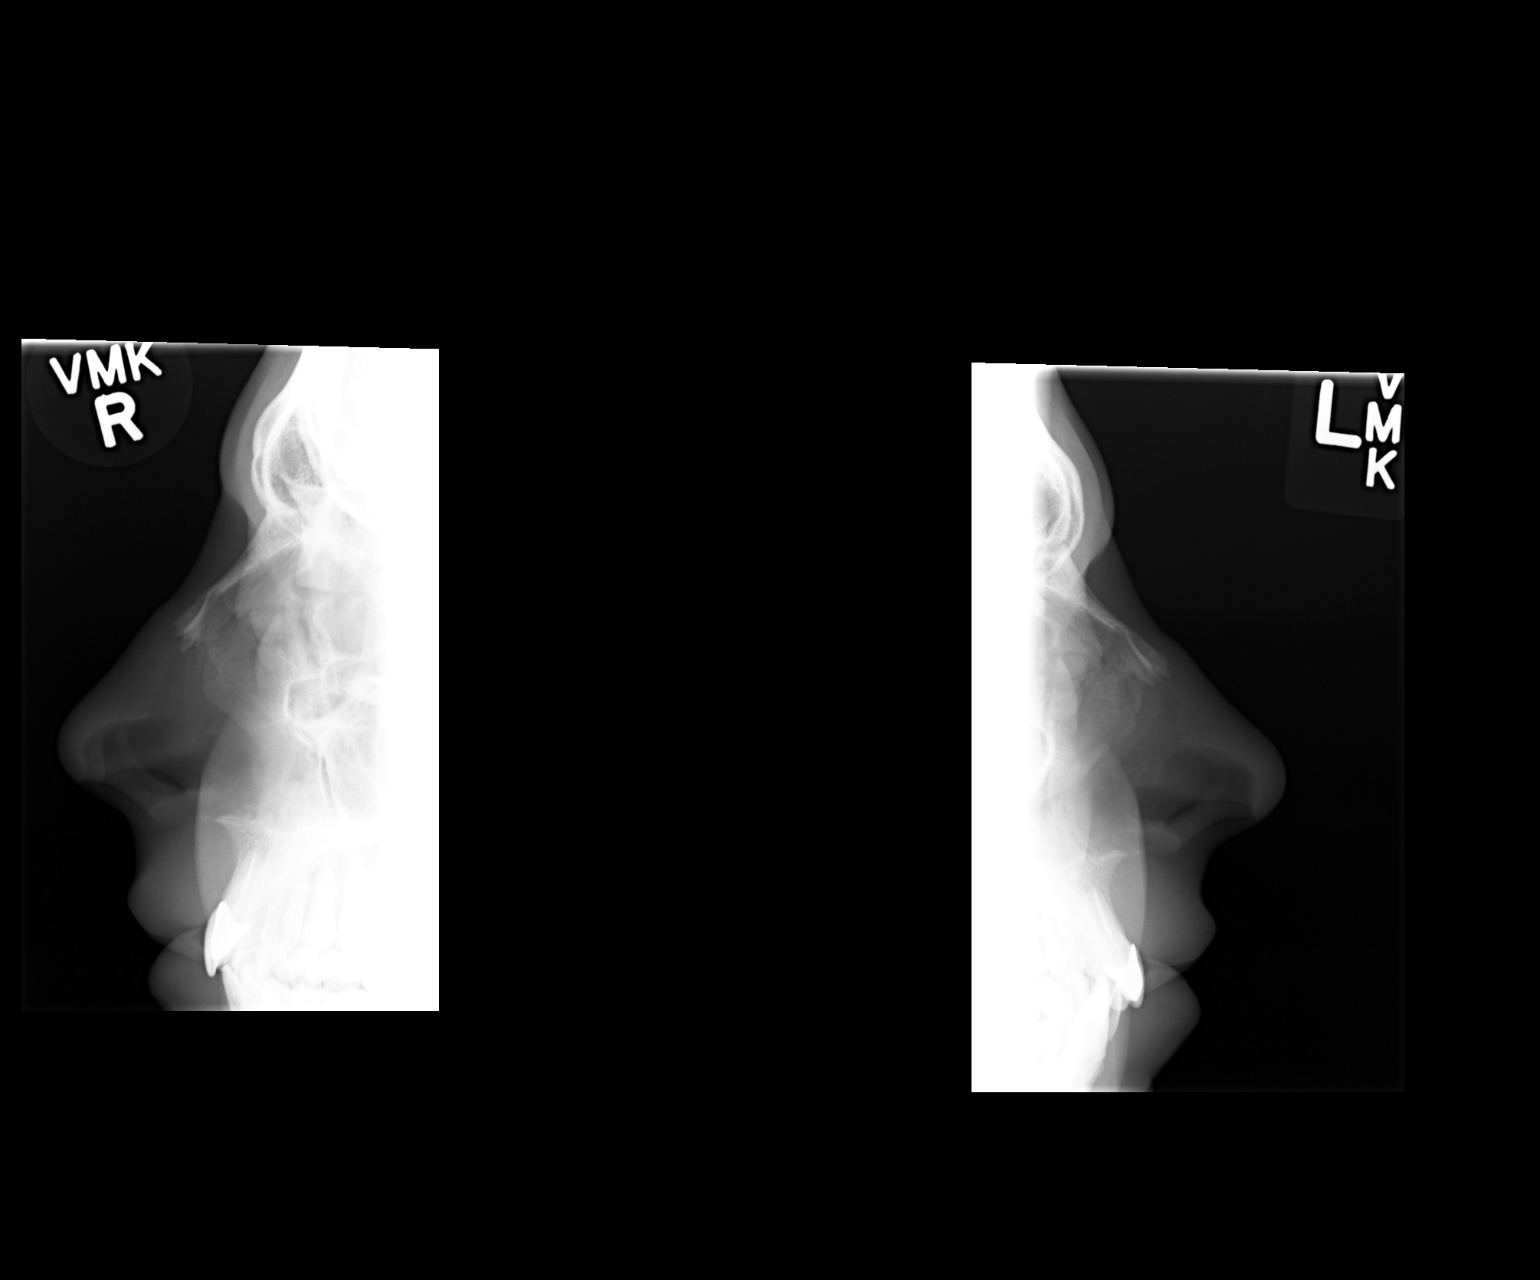

[2 of 2 positions shown; findings below may reference images not displayed]

FINDINGS: There is a slightly depressed fracture of the distal nasal bone. I
cannot tell whether this is to the left or right of midline. There
is also a tiny avulsion from the tip of the anterior maxillary
spine.
IMPRESSION: Slightly depressed nasal bone fracture and avulsion at the tip of
the anterior maxillary spine.

## 2014-10-03 ENCOUNTER — Telehealth: Payer: Self-pay | Admitting: Physician Assistant

## 2014-10-03 DIAGNOSIS — F411 Generalized anxiety disorder: Secondary | ICD-10-CM

## 2014-10-03 MED ORDER — ESCITALOPRAM OXALATE 10 MG PO TABS: 10.0000 mg | ORAL_TABLET | Freq: Every day | ORAL | Status: DC

## 2014-10-03 NOTE — Telephone Encounter (Signed)
I have refilled lexapro. Ok to phone in refill of the Valium same quantity with 2 refills.  Please let patient know when sent in. Thank you.

## 2014-10-04 MED ORDER — DIAZEPAM 2 MG PO TABS
2.0000 mg | ORAL_TABLET | Freq: Two times a day (BID) | ORAL | Status: DC | PRN
Start: 2014-10-04 — End: 2016-05-26

## 2014-10-04 NOTE — Telephone Encounter (Signed)
Left a message making patient aware that Rxs have been sent to the pharmacy.

## 2014-10-04 NOTE — Addendum Note (Signed)
Addended by: Tylene Fantasia on: 10/04/2014 08:50 AM   Modules accepted: Orders

## 2014-12-21 ENCOUNTER — Encounter: Payer: Self-pay | Admitting: Physician Assistant

## 2014-12-21 ENCOUNTER — Ambulatory Visit (INDEPENDENT_AMBULATORY_CARE_PROVIDER_SITE_OTHER): Payer: BLUE CROSS/BLUE SHIELD | Admitting: Physician Assistant

## 2014-12-21 VITALS — BP 127/78 | HR 76 | Temp 98.0°F | Resp 16 | Ht 69.0 in | Wt 208.5 lb

## 2014-12-21 DIAGNOSIS — F338 Other recurrent depressive disorders: Secondary | ICD-10-CM

## 2014-12-21 DIAGNOSIS — Z23 Encounter for immunization: Secondary | ICD-10-CM | POA: Diagnosis not present

## 2014-12-21 DIAGNOSIS — F39 Unspecified mood [affective] disorder: Secondary | ICD-10-CM

## 2014-12-21 DIAGNOSIS — Z0000 Encounter for general adult medical examination without abnormal findings: Secondary | ICD-10-CM | POA: Diagnosis not present

## 2014-12-21 DIAGNOSIS — K580 Irritable bowel syndrome with diarrhea: Secondary | ICD-10-CM

## 2014-12-21 LAB — COMPREHENSIVE METABOLIC PANEL
ALBUMIN: 4.3 g/dL (ref 3.5–5.2)
ALT: 34 U/L (ref 0–53)
AST: 25 U/L (ref 0–37)
Alkaline Phosphatase: 124 U/L — ABNORMAL HIGH (ref 39–117)
BUN: 12 mg/dL (ref 6–23)
CHLORIDE: 101 meq/L (ref 96–112)
CO2: 29 meq/L (ref 19–32)
CREATININE: 0.88 mg/dL (ref 0.40–1.50)
Calcium: 9.5 mg/dL (ref 8.4–10.5)
GFR: 106.83 mL/min (ref >60.00)
Glucose, Bld: 103 mg/dL — ABNORMAL HIGH (ref 70–99)
Potassium: 3.8 mEq/L (ref 3.5–5.1)
SODIUM: 137 meq/L (ref 135–145)
Total Bilirubin: 1 mg/dL (ref 0.2–1.2)
Total Protein: 7.6 g/dL (ref 6.0–8.3)

## 2014-12-21 LAB — CBC WITH DIFFERENTIAL/PLATELET
Basophils Absolute: 0 10*3/uL (ref 0.0–0.1)
Basophils Relative: 0.4 % (ref 0.0–3.0)
Eosinophils Absolute: 0.2 10*3/uL (ref 0.0–0.7)
Eosinophils Relative: 2.9 % (ref 0.0–5.0)
HCT: 44.5 % (ref 39.0–52.0)
Hemoglobin: 15.4 g/dL (ref 13.0–17.0)
Lymphocytes Relative: 26.8 % (ref 12.0–46.0)
Lymphs Abs: 1.6 10*3/uL (ref 0.7–4.0)
MCHC: 34.5 g/dL (ref 30.0–36.0)
MCV: 98.2 fl (ref 78.0–100.0)
Monocytes Absolute: 0.8 10*3/uL (ref 0.1–1.0)
Monocytes Relative: 13.2 % — ABNORMAL HIGH (ref 3.0–12.0)
Neutro Abs: 3.4 10*3/uL (ref 1.4–7.7)
Neutrophils Relative %: 56.7 % (ref 43.0–77.0)
Platelets: 268 10*3/uL (ref 150.0–400.0)
RBC: 4.53 Mil/uL (ref 4.22–5.81)
RDW: 13.3 % (ref 11.5–15.5)
WBC: 6.1 10*3/uL (ref 4.0–10.5)

## 2014-12-21 LAB — URINALYSIS, ROUTINE W REFLEX MICROSCOPIC
Hgb urine dipstick: NEGATIVE
Ketones, ur: NEGATIVE
Leukocytes, UA: NEGATIVE
Nitrite: NEGATIVE
RBC / HPF: NONE SEEN (ref 0–?)
Specific Gravity, Urine: 1.025 (ref 1.000–1.030)
Urine Glucose: NEGATIVE
Urobilinogen, UA: 2 — AB (ref 0.0–1.0)
pH: 6 (ref 5.0–8.0)

## 2014-12-21 LAB — LIPID PANEL
Cholesterol: 178 mg/dL (ref 0–200)
HDL: 44.3 mg/dL (ref >39.00)
LDL Cholesterol: 107 mg/dL — ABNORMAL HIGH (ref 0–99)
NonHDL: 134.08
Total CHOL/HDL Ratio: 4
Triglycerides: 135 mg/dL (ref 0.0–149.0)
VLDL: 27 mg/dL (ref 0.0–40.0)

## 2014-12-21 NOTE — Addendum Note (Signed)
Addended by: Regis BillSCATES, Maryruth Apple L on: 12/21/2014 02:09 PM   Modules accepted: Orders

## 2014-12-21 NOTE — Assessment & Plan Note (Signed)
Classic symptoms. Increase fluids. Start a probiotic. Will check labs today.

## 2014-12-21 NOTE — Progress Notes (Signed)
Patient presents to clinic today for annual examination. Patient is fasting for labs today.  Health Maintenance: Dental -- Overdue.  Vision -- up-to-date Immunizations -- Flu shot today. Up-to-date on Tetanus.  Acute Concerns: Endorses abdominal cramping and loose stools after meals. Symptoms are alleviated with defecation. Denies heartburn or indigestion. Endorses some increased belching. Denies melena,hematochezia or tenesmus. Has not taken anything for symptoms.  Chronic Medical Issues: Anxiety/Depression -- Was previously on Lexapro which did well for mood but made him feel "like a zombie". Patient decided to wean himself off of medication. Endorses doing well overall now but does have a seasonal component to symptoms. Denies depressed mood at present. Denies SI/HI.  Past Medical History  Diagnosis Date  . Chicken pox   . Anxiety   . Depression     Current Outpatient Prescriptions on File Prior to Visit  Medication Sig Dispense Refill  . diazepam (VALIUM) 2 MG tablet Take 1 tablet (2 mg total) by mouth every 12 (twelve) hours as needed for anxiety. 30 tablet 2   No current facility-administered medications on file prior to visit.    Allergies  Allergen Reactions  . Skelaxin [Metaxalone] Swelling and Rash    Family History  Problem Relation Age of Onset  . Adopted: Yes  . Other      Adopted    Social History   Social History  . Marital Status: Single    Spouse Name: N/A  . Number of Children: N/A  . Years of Education: N/A   Social History Main Topics  . Smoking status: Current Every Day Smoker -- 0.25 packs/day for 10 years    Types: Cigarettes  . Smokeless tobacco: Never Used  . Alcohol Use: 7.8 oz/week    5 Glasses of wine, 5 Cans of beer, 3 Standard drinks or equivalent per week  . Drug Use: No  . Sexual Activity: Yes    Birth Control/ Protection: Condom     Comment: men    Other Topics Concern  . None   Social History Narrative   Review of  Systems  Constitutional: Negative for fever and weight loss.  HENT: Negative for ear discharge, ear pain, hearing loss and tinnitus.   Eyes: Negative for blurred vision, double vision, photophobia and pain.  Respiratory: Negative for cough and shortness of breath.   Cardiovascular: Negative for chest pain and palpitations.  Gastrointestinal: Positive for diarrhea. Negative for heartburn, nausea, vomiting, abdominal pain, constipation, blood in stool and melena.  Genitourinary: Negative for dysuria, urgency, frequency, hematuria and flank pain.  Musculoskeletal: Negative for falls.  Neurological: Negative for dizziness, loss of consciousness and headaches.  Endo/Heme/Allergies: Negative for environmental allergies.  Psychiatric/Behavioral: Negative for depression, suicidal ideas, hallucinations and substance abuse. The patient is not nervous/anxious and does not have insomnia.      BP 127/78 mmHg  Pulse 76  Temp(Src) 98 F (36.7 C) (Oral)  Resp 16  Ht  (1.753 m)  Wt 208 lb 8 oz (94.575 kg)  BMI 30.78 kg/m2  SpO2 100%  Physical Exam  Constitutional: He is oriented to person, place, and time and well-developed, well-nourished, and in no distress.  HENT:  Head: Normocephalic and atraumatic.  Right Ear: External ear normal.  Left Ear: External ear normal.  Nose: Nose normal.  Mouth/Throat: Oropharynx is clear and moist. No oropharyngeal exudate.  Eyes: Conjunctivae and EOM are normal. Pupils are equal, round, and reactive to light.  Neck: Neck supple. No thyromegaly present.  Cardiovascular: Normal  rate, regular rhythm, normal heart sounds and intact distal pulses.   Pulmonary/Chest: Effort normal and breath sounds normal. No respiratory distress. He has no wheezes. He has no rales. He exhibits no tenderness.  Abdominal: Soft. Bowel sounds are normal. He exhibits no distension and no mass. There is no tenderness. There is no rebound and no guarding.  Genitourinary:  Testes/scrotum normal and penis normal. No discharge found.  Lymphadenopathy:    He has no cervical adenopathy.  Neurological: He is alert and oriented to person, place, and time.  Skin: Skin is warm and dry. No rash noted.  Psychiatric: Affect normal.  Vitals reviewed.   No results found for this or any previous visit (from the past 2160 hour(s)).  Assessment/Plan: Visit for preventive health examination Depression screen negative. Health Maintenance reviewed -- Tetanus up-to-date. Flu shot given Preventive schedule discussed and handout given in AVS. Will obtain fasting labs today.   Seasonal affective disorder Asymptomatic presently. PHQ-2 with score of 0. Will monitor as we head into winter months  Irritable bowel syndrome with diarrhea Classic symptoms. Increase fluids. Start a probiotic. Will check labs today.

## 2014-12-21 NOTE — Assessment & Plan Note (Signed)
Asymptomatic presently. PHQ-2 with score of 0. Will monitor as we head into winter months

## 2014-12-21 NOTE — Assessment & Plan Note (Signed)
Depression screen negative. Health Maintenance reviewed -- Tetanus up-to-date. Flu shot given Preventive schedule discussed and handout given in AVS. Will obtain fasting labs today.

## 2014-12-21 NOTE — Addendum Note (Signed)
Addended by: Marcelline MatesMARTIN, Montoya Brandel on: 12/21/2014 01:27 PM   Modules accepted: Kipp BroodSmartSet

## 2014-12-21 NOTE — Progress Notes (Signed)
Pre visit review using our clinic review tool, if applicable. No additional management support is needed unless otherwise documented below in the visit note/SLS  

## 2014-12-21 NOTE — Patient Instructions (Signed)
Please go to the lab for blood work.  I will call you with your results. If your blood work is normal we will follow-up yearly for physicals.  If anything is abnormal we will treat and get you in for follow-up.  Please start a daily probiotic. Some brands include Align, Culturelle, Digestive Advantage. Stay hydrated and limit fatty foods to help with IBS symptoms.  Preventive Care for Adults, Male A healthy lifestyle and preventive care can promote health and wellness. Preventive health guidelines for men include the following key practices:  A routine yearly physical is a good way to check with your health care provider about your health and preventative screening. It is a chance to share any concerns and updates on your health and to receive a thorough exam.  Visit your dentist for a routine exam and preventative care every 6 months. Brush your teeth twice a day and floss once a day. Good oral hygiene prevents tooth decay and gum disease.  The frequency of eye exams is based on your age, health, family medical history, use of contact lenses, and other factors. Follow your health care provider's recommendations for frequency of eye exams.  Eat a healthy diet. Foods such as vegetables, fruits, whole grains, low-fat dairy products, and lean protein foods contain the nutrients you need without too many calories. Decrease your intake of foods high in solid fats, added sugars, and salt. Eat the right amount of calories for you.Get information about a proper diet from your health care provider, if necessary.  Regular physical exercise is one of the most important things you can do for your health. Most adults should get at least 150 minutes of moderate-intensity exercise (any activity that increases your heart rate and causes you to sweat) each week. In addition, most adults need muscle-strengthening exercises on 2 or more days a week.  Maintain a healthy weight. The body mass index (BMI) is a  screening tool to identify possible weight problems. It provides an estimate of body fat based on height and weight. Your health care provider can find your BMI and can help you achieve or maintain a healthy weight.For adults 20 years and older:  A BMI below 18.5 is considered underweight.  A BMI of 18.5 to 24.9 is normal.  A BMI of 25 to 29.9 is considered overweight.  A BMI of 30 and above is considered obese.  Maintain normal blood lipids and cholesterol levels by exercising and minimizing your intake of saturated fat. Eat a balanced diet with plenty of fruit and vegetables. Blood tests for lipids and cholesterol should begin at age 5 and be repeated every 5 years. If your lipid or cholesterol levels are high, you are over 50, or you are at high risk for heart disease, you may need your cholesterol levels checked more frequently.Ongoing high lipid and cholesterol levels should be treated with medicines if diet and exercise are not working.  If you smoke, find out from your health care provider how to quit. If you do not use tobacco, do not start.  Lung cancer screening is recommended for adults aged 10-80 years who are at high risk for developing lung cancer because of a history of smoking. A yearly low-dose CT scan of the lungs is recommended for people who have at least a 30-pack-year history of smoking and are a current smoker or have quit within the past 15 years. A pack year of smoking is smoking an average of 1 pack of cigarettes a day  for 1 year (for example: 1 pack a day for 30 years or 2 packs a day for 15 years). Yearly screening should continue until the smoker has stopped smoking for at least 15 years. Yearly screening should be stopped for people who develop a health problem that would prevent them from having lung cancer treatment.  If you choose to drink alcohol, do not have more than 2 drinks per day. One drink is considered to be 12 ounces (355 mL) of beer, 5 ounces (148 mL) of  wine, or 1.5 ounces (44 mL) of liquor.  Avoid use of street drugs. Do not share needles with anyone. Ask for help if you need support or instructions about stopping the use of drugs.  High blood pressure causes heart disease and increases the risk of stroke. Your blood pressure should be checked at least every 1-2 years. Ongoing high blood pressure should be treated with medicines, if weight loss and exercise are not effective.  If you are 45-36 years old, ask your health care provider if you should take aspirin to prevent heart disease.  Diabetes screening is done by taking a blood sample to check your blood glucose level after you have not eaten for a certain period of time (fasting). If you are not overweight and you do not have risk factors for diabetes, you should be screened once every 3 years starting at age 58. If you are overweight or obese and you are 29-71 years of age, you should be screened for diabetes every year as part of your cardiovascular risk assessment.  Colorectal cancer can be detected and often prevented. Most routine colorectal cancer screening begins at the age of 68 and continues through age 83. However, your health care provider may recommend screening at an earlier age if you have risk factors for colon cancer. On a yearly basis, your health care provider may provide home test kits to check for hidden blood in the stool. Use of a small camera at the end of a tube to directly examine the colon (sigmoidoscopy or colonoscopy) can detect the earliest forms of colorectal cancer. Talk to your health care provider about this at age 25, when routine screening begins. Direct exam of the colon should be repeated every 5-10 years through age 45, unless early forms of precancerous polyps or small growths are found.  People who are at an increased risk for hepatitis B should be screened for this virus. You are considered at high risk for hepatitis B if:  You were born in a country where  hepatitis B occurs often. Talk with your health care provider about which countries are considered high risk.  Your parents were born in a high-risk country and you have not received a shot to protect against hepatitis B (hepatitis B vaccine).  You have HIV or AIDS.  You use needles to inject street drugs.  You live with, or have sex with, someone who has hepatitis B.  You are a man who has sex with other men (MSM).  You get hemodialysis treatment.  You take certain medicines for conditions such as cancer, organ transplantation, and autoimmune conditions.  Hepatitis C blood testing is recommended for all people born from [birth year] through 1965 and any individual with known risks for hepatitis C.  Practice safe sex. Use condoms and avoid high-risk sexual practices to reduce the spread of sexually transmitted infections (STIs). STIs include gonorrhea, chlamydia, syphilis, trichomonas, herpes, HPV, and human immunodeficiency virus (HIV). Herpes, HIV, and HPV are  viral illnesses that have no cure. They can result in disability, cancer, and death.  If you are a man who has sex with other men, you should be screened at least once per year for:  HIV.  Urethral, rectal, and pharyngeal infection of gonorrhea, chlamydia, or both.  If you are at risk of being infected with HIV, it is recommended that you take a prescription medicine daily to prevent HIV infection. This is called preexposure prophylaxis (PrEP). You are considered at risk if:  You are a man who has sex with other men (MSM) and have other risk factors.  You are a heterosexual man, are sexually active, and are at increased risk for HIV infection.  You take drugs by injection.  You are sexually active with a partner who has HIV.  Talk with your health care provider about whether you are at high risk of being infected with HIV. If you choose to begin PrEP, you should first be tested for HIV. You should then be tested every 3 months for  as long as you are taking PrEP.  A one-time screening for abdominal aortic aneurysm (AAA) and surgical repair of large AAAs by ultrasound are recommended for men ages 8 to 57 years who are current or former smokers.  Healthy men should no longer receive prostate-specific antigen (PSA) blood tests as part of routine cancer screening. Talk with your health care provider about prostate cancer screening.  Testicular cancer screening is not recommended for adult males who have no symptoms. Screening includes self-exam, a health care provider exam, and other screening tests. Consult with your health care provider about any symptoms you have or any concerns you have about testicular cancer.  Use sunscreen. Apply sunscreen liberally and repeatedly throughout the day. You should seek shade when your shadow is shorter than you. Protect yourself by wearing long sleeves, pants, a wide-brimmed hat, and sunglasses year round, whenever you are outdoors.  Once a month, do a whole-body skin exam, using a mirror to look at the skin on your back. Tell your health care provider about new moles, moles that have irregular borders, moles that are larger than a pencil eraser, or moles that have changed in shape or color.  Stay current with required vaccines (immunizations).  Influenza vaccine. All adults should be immunized every year.  Tetanus, diphtheria, and acellular pertussis (Td, Tdap) vaccine. An adult who has not previously received Tdap or who does not know his vaccine status should receive 1 dose of Tdap. This initial dose should be followed by tetanus and diphtheria toxoids (Td) booster doses every 10 years. Adults with an unknown or incomplete history of completing a 3-dose immunization series with Td-containing vaccines should begin or complete a primary immunization series including a Tdap dose. Adults should receive a Td booster every 10 years.  Varicella vaccine. An adult without evidence of immunity to  varicella should receive 2 doses or a second dose if he has previously received 1 dose.  Human papillomavirus (HPV) vaccine. Males aged 11-21 years who have not received the vaccine previously should receive the 3-dose series. Males aged 22-26 years may be immunized. Immunization is recommended through the age of 91 years for any male who has sex with males and did not get any or all doses earlier. Immunization is recommended for any person with an immunocompromised condition through the age of 35 years if he did not get any or all doses earlier. During the 3-dose series, the second dose should be obtained  4-8 weeks after the first dose. The third dose should be obtained 24 weeks after the first dose and 16 weeks after the second dose.  Zoster vaccine. One dose is recommended for adults aged 61 years or older unless certain conditions are present.  Measles, mumps, and rubella (MMR) vaccine. Adults born before [birth year] generally are considered immune to measles and mumps. Adults born in [birth year] or later should have 1 or more doses of MMR vaccine unless there is a contraindication to the vaccine or there is laboratory evidence of immunity to each of the three diseases. A routine second dose of MMR vaccine should be obtained at least 28 days after the first dose for students attending postsecondary schools, health care workers, or international travelers. People who received inactivated measles vaccine or an unknown type of measles vaccine during 1963-1967 should receive 2 doses of MMR vaccine. People who received inactivated mumps vaccine or an unknown type of mumps vaccine before 1979 and are at high risk for mumps infection should consider immunization with 2 doses of MMR vaccine. Unvaccinated health care workers born before 25 who lack laboratory evidence of measles, mumps, or rubella immunity or laboratory confirmation of disease should consider measles and mumps immunization with 2 doses of MMR vaccine or  rubella immunization with 1 dose of MMR vaccine.  Pneumococcal 13-valent conjugate (PCV13) vaccine. When indicated, a person who is uncertain of his immunization history and has no record of immunization should receive the PCV13 vaccine. All adults 90 years of age and older should receive this vaccine. An adult aged 18 years or older who has certain medical conditions and has not been previously immunized should receive 1 dose of PCV13 vaccine. This PCV13 should be followed with a dose of pneumococcal polysaccharide (PPSV23) vaccine. Adults who are at high risk for pneumococcal disease should obtain the PPSV23 vaccine at least 8 weeks after the dose of PCV13 vaccine. Adults older than 31 years of age who have normal immune system function should obtain the PPSV23 vaccine dose at least 1 year after the dose of PCV13 vaccine.  Pneumococcal polysaccharide (PPSV23) vaccine. When PCV13 is also indicated, PCV13 should be obtained first. All adults aged 87 years and older should be immunized. An adult younger than age 53 years who has certain medical conditions should be immunized. Any person who resides in a nursing home or long-term care facility should be immunized. An adult smoker should be immunized. People with an immunocompromised condition and certain other conditions should receive both PCV13 and PPSV23 vaccines. People with human immunodeficiency virus (HIV) infection should be immunized as soon as possible after diagnosis. Immunization during chemotherapy or radiation therapy should be avoided. Routine use of PPSV23 vaccine is not recommended for American Indians, [place] Natives, or people younger than 65 years unless there are medical conditions that require PPSV23 vaccine. When indicated, people who have unknown immunization and have no record of immunization should receive PPSV23 vaccine. One-time revaccination 5 years after the first dose of PPSV23 is recommended for people aged 19-64 years who have  chronic kidney failure, nephrotic syndrome, asplenia, or immunocompromised conditions. People who received 1-2 doses of PPSV23 before age 34 years should receive another dose of PPSV23 vaccine at age 23 years or later if at least 5 years have passed since the previous dose. Doses of PPSV23 are not needed for people immunized with PPSV23 at or after age 89 years.  Meningococcal vaccine. Adults with asplenia or persistent complement component deficiencies should receive 2 doses  of quadrivalent meningococcal conjugate (MenACWY-D) vaccine. The doses should be obtained at least 2 months apart. Microbiologists working with certain meningococcal bacteria, [employer] recruits, people at risk during an outbreak, and people who travel to or live in countries with a high rate of meningitis should be immunized. A first-year college student up through age 65 years who is living in a residence hall should receive a dose if he did not receive a dose on or after his 16th birthday. Adults who have certain high-risk conditions should receive one or more doses of vaccine.  Hepatitis A vaccine. Adults who wish to be protected from this disease, have chronic liver disease, work with hepatitis A-infected animals, work in hepatitis A research labs, or travel to or work in countries with a high rate of hepatitis A should be immunized. Adults who were previously unvaccinated and who anticipate close contact with an international adoptee during the first 60 days after arrival in the [place] from a country with a high rate of hepatitis A should be immunized.  Hepatitis B vaccine. Adults should be immunized if they wish to be protected from this disease, are under age 39 years and have diabetes, have chronic liver disease, have had more than one sex partner in the past 6 months, may be exposed to blood or other infectious body fluids, are household contacts or sex partners of hepatitis B positive people, are clients or workers in  certain care facilities, or travel to or work in countries with a high rate of hepatitis B.  Haemophilus influenzae type b (Hib) vaccine. A previously unvaccinated person with asplenia or sickle cell disease or having a scheduled splenectomy should receive 1 dose of Hib vaccine. Regardless of previous immunization, a recipient of a hematopoietic stem cell transplant should receive a 3-dose series 6-12 months after his successful transplant. Hib vaccine is not recommended for adults with HIV infection. Preventive Service / Frequency Ages 37 to 25  Blood pressure check.** / Every 3-5 years.  Lipid and cholesterol check.** / Every 5 years beginning at age 45.  Hepatitis C blood test.** / For any individual with known risks for hepatitis C.  Skin self-exam. / Monthly.  Influenza vaccine. / Every year.  Tetanus, diphtheria, and acellular pertussis (Tdap, Td) vaccine.** / Consult your health care provider. 1 dose of Td every 10 years.  Varicella vaccine.** / Consult your health care provider.  HPV vaccine. / 3 doses over 6 months, if 15 or younger.  Measles, mumps, rubella (MMR) vaccine.** / You need at least 1 dose of MMR if you were born in 1957 or later. You may also need a second dose.  Pneumococcal 13-valent conjugate (PCV13) vaccine.** / Consult your health care provider.  Pneumococcal polysaccharide (PPSV23) vaccine.** / 1 to 2 doses if you smoke cigarettes or if you have certain conditions.  Meningococcal vaccine.** / 1 dose if you are age 65 to 82 years and a Market researcher living in a residence hall, or have one of several medical conditions. You may also need additional booster doses.  Hepatitis A vaccine.** / Consult your health care provider.  Hepatitis B vaccine.** / Consult your health care provider.  Haemophilus influenzae type b (Hib) vaccine.** / Consult your health care provider. Ages 75 to 37  Blood pressure check.** / Every year.  Lipid and  cholesterol check.** / Every 5 years beginning at age 109.  Lung cancer screening. / Every year if you are aged 26-80 years and have a 30-pack-year history  of smoking and currently smoke or have quit within the past 15 years. Yearly screening is stopped once you have quit smoking for at least 15 years or develop a health problem that would prevent you from having lung cancer treatment.  Fecal occult blood test (FOBT) of stool. / Every year beginning at age 55 and continuing until age 43. You may not have to do this test if you get a colonoscopy every 10 years.  Flexible sigmoidoscopy** or colonoscopy.** / Every 5 years for a flexible sigmoidoscopy or every 10 years for a colonoscopy beginning at age 75 and continuing until age 69.  Hepatitis C blood test.** / For all people born from [birth year] through 1965 and any individual with known risks for hepatitis C.  Skin self-exam. / Monthly.  Influenza vaccine. / Every year.  Tetanus, diphtheria, and acellular pertussis (Tdap/Td) vaccine.** / Consult your health care provider. 1 dose of Td every 10 years.  Varicella vaccine.** / Consult your health care provider.  Zoster vaccine.** / 1 dose for adults aged 62 years or older.  Measles, mumps, rubella (MMR) vaccine.** / You need at least 1 dose of MMR if you were born in 1957 or later. You may also need a second dose.  Pneumococcal 13-valent conjugate (PCV13) vaccine.** / Consult your health care provider.  Pneumococcal polysaccharide (PPSV23) vaccine.** / 1 to 2 doses if you smoke cigarettes or if you have certain conditions.  Meningococcal vaccine.** / Consult your health care provider.  Hepatitis A vaccine.** / Consult your health care provider.  Hepatitis B vaccine.** / Consult your health care provider.  Haemophilus influenzae type b (Hib) vaccine.** / Consult your health care provider. Ages 63 and over  Blood pressure check.** / Every year.  Lipid and cholesterol check.**/ Every 5 years  beginning at age 57.  Lung cancer screening. / Every year if you are aged 25-80 years and have a 30-pack-year history of smoking and currently smoke or have quit within the past 15 years. Yearly screening is stopped once you have quit smoking for at least 15 years or develop a health problem that would prevent you from having lung cancer treatment.  Fecal occult blood test (FOBT) of stool. / Every year beginning at age 3 and continuing until age 52. You may not have to do this test if you get a colonoscopy every 10 years.  Flexible sigmoidoscopy** or colonoscopy.** / Every 5 years for a flexible sigmoidoscopy or every 10 years for a colonoscopy beginning at age 56 and continuing until age 54.  Hepatitis C blood test.** / For all people born from [birth year] through 1967 and any individual with known risks for hepatitis C.  Abdominal aortic aneurysm (AAA) screening.** / A one-time screening for ages 71 to 27 years who are current or former smokers.  Skin self-exam. / Monthly.  Influenza vaccine. / Every year.  Tetanus, diphtheria, and acellular pertussis (Tdap/Td) vaccine.** / 1 dose of Td every 10 years.  Varicella vaccine.** / Consult your health care provider.  Zoster vaccine.** / 1 dose for adults aged 48 years or older.  Pneumococcal 13-valent conjugate (PCV13) vaccine.** / 1 dose for all adults aged 37 years and older.  Pneumococcal polysaccharide (PPSV23) vaccine.** / 1 dose for all adults aged 25 years and older.  Meningococcal vaccine.** / Consult your health care provider.  Hepatitis A vaccine.** / Consult your health care provider.  Hepatitis B vaccine.** / Consult your health care provider.  Haemophilus influenzae type b (Hib) vaccine.** / Consult  your health care provider. **Family history and personal history of risk and conditions may change your health care provider's recommendations.   This information is not intended to replace advice given to you by your health care  provider. Make sure you discuss any questions you have with your health care provider.   Document Released: 04/14/2001 Document Revised: 03/09/2014 Document Reviewed: 07/14/2010 Elsevier Interactive Patient Education [employer].

## 2014-12-22 ENCOUNTER — Encounter: Payer: Self-pay | Admitting: Physician Assistant

## 2014-12-22 DIAGNOSIS — R8290 Unspecified abnormal findings in urine: Secondary | ICD-10-CM

## 2015-01-07 ENCOUNTER — Other Ambulatory Visit (INDEPENDENT_AMBULATORY_CARE_PROVIDER_SITE_OTHER): Payer: BLUE CROSS/BLUE SHIELD

## 2015-01-07 ENCOUNTER — Telehealth: Payer: Self-pay | Admitting: *Deleted

## 2015-01-07 DIAGNOSIS — R899 Unspecified abnormal finding in specimens from other organs, systems and tissues: Secondary | ICD-10-CM

## 2015-01-07 DIAGNOSIS — R8290 Unspecified abnormal findings in urine: Secondary | ICD-10-CM

## 2015-01-07 LAB — URINALYSIS, ROUTINE W REFLEX MICROSCOPIC
Bilirubin Urine: NEGATIVE
HGB URINE DIPSTICK: NEGATIVE
Ketones, ur: NEGATIVE
Leukocytes, UA: NEGATIVE
Nitrite: NEGATIVE
RBC / HPF: NONE SEEN (ref 0–?)
SPECIFIC GRAVITY, URINE: 1.02 (ref 1.000–1.030)
Total Protein, Urine: NEGATIVE
URINE GLUCOSE: NEGATIVE
UROBILINOGEN UA: 0.2 (ref 0.0–1.0)
WBC UA: NONE SEEN (ref 0–?)
pH: 6.5 (ref 5.0–8.0)

## 2015-01-07 LAB — COMPREHENSIVE METABOLIC PANEL
ALBUMIN: 4.2 g/dL (ref 3.5–5.2)
ALK PHOS: 110 U/L (ref 39–117)
ALT: 26 U/L (ref 0–53)
AST: 23 U/L (ref 0–37)
BUN: 13 mg/dL (ref 6–23)
CALCIUM: 9.5 mg/dL (ref 8.4–10.5)
CHLORIDE: 102 meq/L (ref 96–112)
CO2: 25 mEq/L (ref 19–32)
Creatinine, Ser: 0.9 mg/dL (ref 0.40–1.50)
GFR: 104.06 mL/min (ref >60.00)
Glucose, Bld: 98 mg/dL (ref 70–99)
POTASSIUM: 4.3 meq/L (ref 3.5–5.1)
Sodium: 139 mEq/L (ref 135–145)
TOTAL PROTEIN: 7.5 g/dL (ref 6.0–8.3)
Total Bilirubin: 0.6 mg/dL (ref 0.2–1.2)

## 2015-01-07 LAB — CBC WITH DIFFERENTIAL/PLATELET
BASOS PCT: 0.5 % (ref 0.0–3.0)
Basophils Absolute: 0 10*3/uL (ref 0.0–0.1)
EOS PCT: 2.1 % (ref 0.0–5.0)
Eosinophils Absolute: 0.1 10*3/uL (ref 0.0–0.7)
HEMATOCRIT: 46.3 % (ref 39.0–52.0)
HEMOGLOBIN: 15.8 g/dL (ref 13.0–17.0)
Lymphocytes Relative: 26.4 % (ref 12.0–46.0)
Lymphs Abs: 1.8 10*3/uL (ref 0.7–4.0)
MCHC: 34.1 g/dL (ref 30.0–36.0)
MCV: 98.8 fl (ref 78.0–100.0)
MONO ABS: 0.7 10*3/uL (ref 0.1–1.0)
MONOS PCT: 9.6 % (ref 3.0–12.0)
Neutro Abs: 4.3 10*3/uL (ref 1.4–7.7)
Neutrophils Relative %: 61.4 % (ref 43.0–77.0)
Platelets: 273 10*3/uL (ref 150.0–400.0)
RBC: 4.68 Mil/uL (ref 4.22–5.81)
RDW: 13 % (ref 11.5–15.5)
WBC: 7 10*3/uL (ref 4.0–10.5)

## 2015-01-07 NOTE — Telephone Encounter (Signed)
Patient note from provider states that we will recheck Urine & Blood testing; orders placed for abnormal resulted labs [CBC diff, CMET] for lab draw/SLS

## 2015-01-07 NOTE — Addendum Note (Signed)
Addended by: Regis BillSCATES, Zayon Trulson L on: 01/07/2015 08:14 AM   Modules accepted: Orders

## 2015-01-07 NOTE — Addendum Note (Signed)
Addended by: Verdie ShireBAYNES, Gerrianne Aydelott M on: 01/07/2015 09:26 AM   Modules accepted: Orders

## 2015-01-07 NOTE — Telephone Encounter (Signed)
Labs canceled and reordered as Future/SLS

## 2015-01-08 ENCOUNTER — Encounter: Payer: Self-pay | Admitting: Physician Assistant

## 2016-05-26 ENCOUNTER — Encounter: Payer: Self-pay | Admitting: Physician Assistant

## 2016-05-26 ENCOUNTER — Ambulatory Visit (INDEPENDENT_AMBULATORY_CARE_PROVIDER_SITE_OTHER): Payer: BLUE CROSS/BLUE SHIELD | Admitting: Physician Assistant

## 2016-05-26 VITALS — BP 122/78 | HR 72 | Temp 98.7°F | Resp 14 | Ht 69.0 in | Wt 222.0 lb

## 2016-05-26 DIAGNOSIS — R350 Frequency of micturition: Secondary | ICD-10-CM | POA: Diagnosis not present

## 2016-05-26 DIAGNOSIS — N410 Acute prostatitis: Secondary | ICD-10-CM

## 2016-05-26 LAB — POCT URINALYSIS DIPSTICK
Blood, UA: NEGATIVE
Nitrite, UA: POSITIVE
PH UA: 5.5 (ref 5.0–8.0)
Spec Grav, UA: 1.03 (ref 1.030–1.035)
Urobilinogen, UA: 4 — AB (ref ?–2.0)

## 2016-05-26 LAB — GLUCOSE, POCT (MANUAL RESULT ENTRY): POC Glucose: 95 mg/dl (ref 70–99)

## 2016-05-26 MED ORDER — SULFAMETHOXAZOLE-TRIMETHOPRIM 800-160 MG PO TABS: 1.0000 | ORAL_TABLET | Freq: Two times a day (BID) | ORAL | 0 refills | Status: DC

## 2016-05-26 MED ORDER — NICOTINE 21 MG/24HR TD PT24: 21.0000 mg | MEDICATED_PATCH | Freq: Every day | TRANSDERMAL | 0 refills | Status: DC

## 2016-05-26 NOTE — Progress Notes (Signed)
Pre visit review using our clinic review tool, if applicable. No additional management support is needed unless otherwise documented below in the visit note. 

## 2016-05-26 NOTE — Patient Instructions (Signed)
Please stay well hydrated.  Limit caffeine as it can irritate the bladder and cause spasms.  Start the antibiotic and take as directed. I will call you with your culture results and we will alter regimen accordingly.  If you note inability to pass urine or develop fever while on antibiotic, please go to the ER as more acute assessment may be needed.   Prostatitis Prostatitis is swelling of the prostate gland. The prostate helps to make semen. It is below a man's bladder, in front of the rectum. There are different types of prostatitis. Follow these instructions at home:  Take over-the-counter and prescription medicines only as told by your doctor.  If you were prescribed an antibiotic medicine, take it as told by your doctor. Do not stop taking the antibiotic even if you start to feel better.  If your doctor prescribed exercises, do them as directed.  Take sitz baths as told by your doctor. To take a sitz bath, sit in warm water that is deep enough to cover your hips and butt.  Keep all follow-up visits as told by your doctor. This is important. Contact a doctor if:  Your symptoms get worse.  You have a fever. Get help right away if:  You have chills.  You feel sick to your stomach (nauseous).  You throw up (vomit).  You feel light-headed.  You feel like you might pass out (faint).  You cannot pee (urinate).  You have blood or clumps of blood (blood clots) in your pee (urine). This information is not intended to replace advice given to you by your health care provider. Make sure you discuss any questions you have with your health care provider. Document Released: 08/18/2011 Document Revised: 11/07/2015 Document Reviewed: 11/07/2015 Elsevier Interactive Patient Education  2017 [employer]

## 2016-05-26 NOTE — Progress Notes (Signed)
   Patient presents to clinic today c/o 1 day of urinary urgency, frequency with suprapubic pressure. Denies fever, nausea or vomiting. Notes significant urinary hesitancy. Denies trauma or injury. Denies penile pain, testicular pain or swelling. Denies hematuria. Denies history of stone. Denies any sexual activity in the past year. Denies rectal pain. Is taking AZO given by a friend which has turned urine orange.  Patient would also like to discuss smoking cessation -- smoking about 1/2 ppd x several months. Would like to discuss smoking cessation. Has tried Nicorette gum. Has not tried patches. Has tried Chantix with bad side effects.   Past Medical History:  Diagnosis Date  . Anxiety   . Chicken pox   . Depression     No current outpatient prescriptions on file prior to visit.   No current facility-administered medications on file prior to visit.     Allergies  Allergen Reactions  . Skelaxin [Metaxalone] Swelling and Rash    Family History  Problem Relation Age of Onset  . Adopted: Yes  . Other      Adopted    Social History   Social History  . Marital status: Single    Spouse name: N/A  . Number of children: N/A  . Years of education: N/A   Social History Main Topics  . Smoking status: Current Every Day Smoker    Packs/day: 0.50    Years: 10.00    Types: Cigarettes  . Smokeless tobacco: Never Used  . Alcohol use 7.8 oz/week    5 Glasses of wine, 5 Cans of beer, 3 Standard drinks or equivalent per week  . Drug use: No  . Sexual activity: Yes    Birth control/ protection: Condom     Comment: men    Other Topics Concern  . None   Social History Narrative  . None   Review of Systems - See HPI.  All other ROS are negative.  BP 122/78   Pulse 72   Temp 98.7 F (37.1 C) (Oral)   Resp 14   Ht 5\' 9"  (1.753 m)   Wt 222 lb (100.7 kg)   SpO2 99%   BMI 32.78 kg/m   Physical Exam  Constitutional: He is oriented to person, place, and time and well-developed,  well-nourished, and in no distress.  HENT:  Head: Normocephalic and atraumatic.  Cardiovascular: Normal rate, regular rhythm, normal heart sounds and intact distal pulses.   Pulmonary/Chest: Effort normal and breath sounds normal. No respiratory distress. He has no wheezes. He has no rales. He exhibits no tenderness.  Abdominal: Soft. Bowel sounds are normal. He exhibits no distension. There is no tenderness. There is no CVA tenderness.  Neurological: He is alert and oriented to person, place, and time.  Skin: Skin is warm and dry. No rash noted.  Psychiatric: Affect normal.  Vitals reviewed.  Assessment/Plan: Acute bacterial prostatitis Suspected giving UTI symptoms plus hesitancy. Urine dip with LE, nitrites and blood. Also with urobilinogen and glucose. Suspect false positives due to the AZO. POCT Glucose at 95 non-fasting. Will send for culture. Start bactrim DS BID x 7 days. Discussed alarm signs/symptoms that would prompt ER assessment. Will alter regimen based on culture. F/U scheduled for 7 days. - Urine culture  Tobacco Use Disorder Start Nicoderm patches. FU scheduled.  Patient voices understanding and agreement with the plan.   Piedad ClimesMartin, Joe Dipinto Cody, PA-C

## 2016-05-27 LAB — URINE CULTURE: Organism ID, Bacteria: NO GROWTH

## 2016-05-28 ENCOUNTER — Telehealth: Payer: Self-pay | Admitting: Emergency Medicine

## 2016-05-28 NOTE — Telephone Encounter (Signed)
Spoke with patient about normal urine culture. He states his symptoms are improving. He has increase water and cranberry juice. Symptoms not worsening. He is at 50% improved. The urge has improved but still has some. Will keep scheduled appt on 06/02/16.

## 2016-05-31 ENCOUNTER — Encounter: Payer: Self-pay | Admitting: Physician Assistant

## 2016-06-01 NOTE — Telephone Encounter (Signed)
Spoke with patient. Symptoms improving but urgency still present. Emptying bladder well first thing in the morning, but has mild hesitancy still during the day.  Has f/u scheduled for tomorrow for reassessment.

## 2016-06-02 ENCOUNTER — Encounter: Payer: Self-pay | Admitting: Physician Assistant

## 2016-06-02 ENCOUNTER — Ambulatory Visit (INDEPENDENT_AMBULATORY_CARE_PROVIDER_SITE_OTHER): Payer: BLUE CROSS/BLUE SHIELD | Admitting: Physician Assistant

## 2016-06-02 VITALS — BP 124/82 | HR 73 | Temp 98.6°F | Resp 14 | Ht 69.0 in | Wt 218.0 lb

## 2016-06-02 DIAGNOSIS — R350 Frequency of micturition: Secondary | ICD-10-CM | POA: Diagnosis not present

## 2016-06-02 LAB — POCT URINALYSIS DIPSTICK
BILIRUBIN UA: NEGATIVE
GLUCOSE UA: NEGATIVE
Ketones, UA: NEGATIVE
Leukocytes, UA: NEGATIVE
Nitrite, UA: NEGATIVE
Protein, UA: NEGATIVE
RBC UA: NEGATIVE
SPEC GRAV UA: 1.02 (ref 1.030–1.035)
Urobilinogen, UA: 0.2 (ref ?–2.0)
pH, UA: 5 (ref 5.0–8.0)

## 2016-06-02 NOTE — Progress Notes (Signed)
Pre visit review using our clinic review tool, if applicable. No additional management support is needed unless otherwise documented below in the visit note. 

## 2016-06-02 NOTE — Patient Instructions (Signed)
Continue good hydration and avoidance of caffeine.  No further antibiotics are needed.  Refrain from sexual activity or masturbation until symptoms are completely resolved. Always make sure to urinate after ejaculation or sexual activity to flush out the urethra. Follow-up if symptoms are not continuing to resolve or new symptoms develop.

## 2016-06-02 NOTE — Progress Notes (Signed)
Patient presents to clinic today for follow-up of urinary frequency and hesitancy. Has taken antibiotics as directed. Noting improvement in symptoms after having medication for 48 hours. Now having regular urinary flow over the past 24 hours. Resolution of suprapubic pressure. Denies fever, chills, back pain. Denies new symptoms.   Past Medical History:  Diagnosis Date  . Anxiety   . Chicken pox   . Depression     Current Outpatient Prescriptions on File Prior to Visit  Medication Sig Dispense Refill  . fexofenadine (ALLEGRA) 180 MG tablet Take 180 mg by mouth daily.    Marland Kitchen ibuprofen (ADVIL,MOTRIN) 200 MG tablet Take 200 mg by mouth every 6 (six) hours as needed.    . nicotine (NICODERM CQ) 21 mg/24hr patch Place 1 patch (21 mg total) onto the skin daily. (Patient not taking: Reported on 06/02/2016) 28 patch 0   No current facility-administered medications on file prior to visit.     Allergies  Allergen Reactions  . Skelaxin [Metaxalone] Swelling and Rash    Family History  Problem Relation Age of Onset  . Adopted: Yes  . Other      Adopted    Social History   Social History  . Marital status: Single    Spouse name: N/A  . Number of children: N/A  . Years of education: N/A   Social History Main Topics  . Smoking status: Current Every Day Smoker    Packs/day: 0.50    Years: 10.00    Types: Cigarettes  . Smokeless tobacco: Never Used  . Alcohol use 7.8 oz/week    5 Glasses of wine, 5 Cans of beer, 3 Standard drinks or equivalent per week  . Drug use: No  . Sexual activity: Yes    Birth control/ protection: Condom     Comment: men    Other Topics Concern  . None   Social History Narrative  . None   Review of Systems - See HPI.  All other ROS are negative.  BP 124/82   Pulse 73   Temp 98.6 F (37 C) (Oral)   Resp 14   Ht  (1.753 m)   Wt 218 lb (98.9 kg)   SpO2 98%   BMI 32.19 kg/m   Physical Exam  Constitutional: He is oriented to person, place,  and time and well-developed, well-nourished, and in no distress.  HENT:  Head: Normocephalic and atraumatic.  Eyes: Conjunctivae are normal.  Neck: Neck supple.  Cardiovascular: Normal rate, regular rhythm, normal heart sounds and intact distal pulses.   Pulmonary/Chest: Effort normal and breath sounds normal. No respiratory distress. He has no wheezes. He has no rales. He exhibits no tenderness.  Abdominal: Soft. Bowel sounds are normal. He exhibits no distension. There is no tenderness.  Neurological: He is alert and oriented to person, place, and time.  Skin: Skin is warm and dry. No rash noted.  Psychiatric: Affect normal.  Vitals reviewed.   Recent Results (from the past 2160 hour(s))  POCT Urinalysis Dipstick     Status: Abnormal   Collection Time: 05/26/16  3:32 PM  Result Value Ref Range   Color, UA orange    Clarity, UA clear    Glucose, UA 1+    Bilirubin, UA 2+    Ketones, UA trace    Spec Grav, UA 1.030 1.030 - 1.035   Blood, UA negative    pH, UA 5.5 5.0 - 8.0   Protein, UA trace    Urobilinogen, UA  4.0 (A) Negative - 2.0   Nitrite, UA positive    Leukocytes, UA large (3+) (A) Negative  Urine culture     Status: None   Collection Time: 05/26/16  3:49 PM  Result Value Ref Range   Organism ID, Bacteria NO GROWTH   POCT Glucose (CBG)     Status: Normal   Collection Time: 05/26/16  3:52 PM  Result Value Ref Range   POC Glucose 95 70 - 99 mg/dl  POCT Urinalysis Dipstick     Status: Normal   Collection Time: 06/02/16  4:15 PM  Result Value Ref Range   Color, UA yellow    Clarity, UA clear    Glucose, UA negative    Bilirubin, UA negative    Ketones, UA negative    Spec Grav, UA 1.020 1.030 - 1.035   Blood, UA negative    pH, UA 5.0 5.0 - 8.0   Protein, UA negative    Urobilinogen, UA 0.2 Negative - 2.0   Nitrite, UA negative    Leukocytes, UA Negative Negative   Assessment/Plan: 1. Urinary frequency Repeat UA off of AZO normal. Symptoms resolving. Finish  ABX for prostatitis. Discussed any recurrence of symptoms or development of new symptoms, would need repeat assessment.   - POCT Urinalysis Dipstick   Piedad Climes, PA-C

## 2016-12-04 ENCOUNTER — Encounter: Payer: Self-pay | Admitting: Physician Assistant

## 2016-12-04 ENCOUNTER — Ambulatory Visit (INDEPENDENT_AMBULATORY_CARE_PROVIDER_SITE_OTHER): Payer: 59 | Admitting: Physician Assistant

## 2016-12-04 ENCOUNTER — Other Ambulatory Visit: Payer: Self-pay | Admitting: General Practice

## 2016-12-04 DIAGNOSIS — Z23 Encounter for immunization: Secondary | ICD-10-CM | POA: Diagnosis not present

## 2016-12-04 DIAGNOSIS — K580 Irritable bowel syndrome with diarrhea: Secondary | ICD-10-CM

## 2016-12-04 MED ORDER — DICYCLOMINE HCL 10 MG PO CAPS: 10.0000 mg | ORAL_CAPSULE | Freq: Three times a day (TID) | ORAL | 0 refills | Status: DC

## 2016-12-04 MED ORDER — LOPERAMIDE HCL 2 MG PO CAPS: 2.0000 mg | ORAL_CAPSULE | ORAL | 0 refills | Status: DC | PRN

## 2016-12-04 NOTE — Progress Notes (Signed)
Pre visit review using our clinic review tool, if applicable. No additional management support is needed unless otherwise documented below in the visit note. 

## 2016-12-04 NOTE — Progress Notes (Signed)
   Patient presents to clinic today c/o flare-up of irritable bowel over the past few weeks secondary to increased stress at work. Notes abdominal cramping and fecal urgency that is alleviated after bowel movement. Denies nausea or vomiting. Denies melena, hematochezia or tenesmus. Notes symptoms are worse when he has had a heavier meal earlier in the day.  Past Medical History:  Diagnosis Date  . Anxiety   . Chicken pox   . Depression     Current Outpatient Prescriptions on File Prior to Visit  Medication Sig Dispense Refill  . fexofenadine (ALLEGRA) 180 MG tablet Take 180 mg by mouth daily.    Marland Kitchen ibuprofen (ADVIL,MOTRIN) 200 MG tablet Take 200 mg by mouth every 6 (six) hours as needed.     No current facility-administered medications on file prior to visit.     Allergies  Allergen Reactions  . Skelaxin [Metaxalone] Swelling and Rash    Family History  Problem Relation Age of Onset  . Adopted: Yes  . Other Unknown        Adopted    Social History   Social History  . Marital status: Single    Spouse name: N/A  . Number of children: N/A  . Years of education: N/A   Social History Main Topics  . Smoking status: Current Every Day Smoker    Packs/day: 0.50    Years: 10.00    Types: Cigarettes  . Smokeless tobacco: Never Used  . Alcohol use 7.8 oz/week    5 Glasses of wine, 5 Cans of beer, 3 Standard drinks or equivalent per week  . Drug use: No  . Sexual activity: Yes    Birth control/ protection: Condom     Comment: men    Other Topics Concern  . None   Social History Narrative  . None   Review of Systems - See HPI.  All other ROS are negative.  BP 134/84   Pulse 75   Temp 98.6 F (37 C) (Oral)   Resp 14   Ht  (1.753 m)   Wt 214 lb (97.1 kg)   SpO2 97%   BMI 31.60 kg/m   Physical Exam  Constitutional: He is oriented to person, place, and time and well-developed, well-nourished, and in no distress.  HENT:  Head: Normocephalic and atraumatic.    Eyes: Conjunctivae are normal.  Neck: Neck supple.  Cardiovascular: Normal rate, regular rhythm, normal heart sounds and intact distal pulses.   Pulmonary/Chest: Effort normal and breath sounds normal. No respiratory distress. He has no wheezes. He has no rales. He exhibits no tenderness.  Abdominal: Soft. Bowel sounds are normal. He exhibits no distension and no mass. There is no tenderness. There is no rebound and no guarding.  Neurological: He is alert and oriented to person, place, and time.  Skin: Skin is warm and dry. No rash noted.  Psychiatric: Affect normal.  Vitals reviewed.  Assessment/Plan: Irritable bowel syndrome with diarrhea Recent stressors. Discussed low-fat, low-spice diet. Start daily probiotic. Immodium if needed. If symptoms still persist he is to start trial of Bentyl. Return precautions discussed.    Piedad Climes, PA-C

## 2016-12-04 NOTE — Assessment & Plan Note (Signed)
Recent stressors. Discussed low-fat, low-spice diet. Start daily probiotic. Immodium if needed. If symptoms still persist he is to start trial of Bentyl. Return precautions discussed.

## 2016-12-04 NOTE — Patient Instructions (Signed)
Please stay well-hydrated and get plenty of rest.  Please limit heavy and spicy foods.  Start immodium if needed for loose stools.  If not improving with diet, start the Bentyl and take as directed. Give me a call if you have to start this.  I am setting you up with Dermatology for the hair loss.

## 2016-12-07 ENCOUNTER — Telehealth: Payer: Self-pay | Admitting: *Deleted

## 2016-12-07 NOTE — Telephone Encounter (Signed)
PA completed and approved for IMODIUM .   Approved through 12/07/2017.  Pharmacy notified for filling.

## 2017-01-01 ENCOUNTER — Telehealth: Payer: Self-pay | Admitting: Physician Assistant

## 2017-01-01 ENCOUNTER — Other Ambulatory Visit: Payer: Self-pay | Admitting: Physician Assistant

## 2017-01-01 DIAGNOSIS — L659 Nonscarring hair loss, unspecified: Secondary | ICD-10-CM

## 2017-01-01 NOTE — Telephone Encounter (Signed)
Pt was calling to check status of a [facility]ermatology referral [facility]. I am not showing one as being placed for pt. Can this be added?

## 2017-01-01 NOTE — Telephone Encounter (Signed)
Referral placed for Dermatology.

## 2017-01-11 ENCOUNTER — Telehealth: Payer: Self-pay | Admitting: Physician Assistant

## 2017-01-11 NOTE — Telephone Encounter (Signed)
Referral has been sent to Dermatology Spec.

## 2017-01-11 NOTE — Telephone Encounter (Signed)
Pt called, requesting referral to different dermatologist. Original dermatologist cannot schedule until January. Pt will be out of town (03/13/17-03/22/17), wants to be seen sooner.

## 2017-03-10 ENCOUNTER — Telehealth: Payer: Self-pay | Admitting: Physician Assistant

## 2017-03-10 NOTE — Telephone Encounter (Signed)
Copied from CRM [id number]. Topic: Inquiry >> Mar 10, 2017  3:32 PM Cipriano BunkerLambe, Annette S wrote: Reason for CRM:  patient [place] derm. [place] [address], [place]  and was told to have lab work drawn to autoimmune and diabetes,, he was not sure what else needed done. Asking if office call her to get all what is needed.   If need to call him will have to leave message and he will call back when they dock.  [place] [employer]  Scheduled a fasting appt for 1/21 at 9:45.

## 2017-03-10 NOTE — Telephone Encounter (Signed)
Appointment was scheduled incorrectly - has been forwarded on to have the appointment corrected

## 2017-03-10 NOTE — Telephone Encounter (Signed)
Would recommend making a CPE so we can get labs covered with insurance. He is overdue.

## 2017-03-10 NOTE — Telephone Encounter (Signed)
Needs lab appointment canceled. Needs to be scheduled with me instead to discus and so all appropriate labs can be ordered.

## 2017-03-12 ENCOUNTER — Encounter: Payer: 59 | Admitting: Physician Assistant

## 2017-03-21 NOTE — Progress Notes (Signed)
Patient presents to clinic today for annual exam.  Patient is fasting for labs.  Diet -- Endorses well-balanced diet overall. Exercise -- Endorses trying to get more active.  Acute Concerns: Patient with alopecia, currently being assessed and managed by Dermatology. They are requesting labs drawn for AI cause of symptoms. Requesting ESR, ANA and other markers.  Health Maintenance: Immunizations -- up-to-date  Past Medical History:  Diagnosis Date  . Anxiety   . Chicken pox   . Depression     Past Surgical History:  Procedure Laterality Date  . Unremarkable.      Current Outpatient Medications on File Prior to Visit  Medication Sig Dispense Refill  . desonide (DESOWEN) 0.05 % ointment APP AA ONCE A DAY  0  . fexofenadine (ALLEGRA) 180 MG tablet Take 180 mg by mouth daily.    Marland Kitchen ibuprofen (ADVIL,MOTRIN) 200 MG tablet Take 200 mg by mouth every 6 (six) hours as needed.     No current facility-administered medications on file prior to visit.     Allergies  Allergen Reactions  . Skelaxin [Metaxalone] Swelling and Rash    Family History  Adopted: Yes  Problem Relation Age of Onset  . Other Unknown        Adopted    Social History   Socioeconomic History  . Marital status: Single    Spouse name: Not on file  . Number of children: Not on file  . Years of education: Not on file  . Highest education level: Not on file  Social Needs  . Financial resource strain: Not on file  . Food insecurity - worry: Not on file  . Food insecurity - inability: Not on file  . Transportation needs - medical: Not on file  . Transportation needs - non-medical: Not on file  Occupational History  . Not on file  Tobacco Use  . Smoking status: Current Every Day Smoker    Packs/day: 0.25    Years: 10.00    Pack years: 2.50    Types: Cigarettes  . Smokeless tobacco: Never Used  Substance and Sexual Activity  . Alcohol use: Yes    Alcohol/week: 7.8 oz    Types: 5 Glasses of wine,  5 Cans of beer, 3 Standard drinks or equivalent per week  . Drug use: No  . Sexual activity: Yes    Birth control/protection: Condom    Comment: men   Other Topics Concern  . Not on file  Social History Narrative  . Not on file   Review of Systems  Constitutional: Negative for fever and weight loss.  HENT: Negative for ear discharge, ear pain, hearing loss and tinnitus.   Eyes: Negative for blurred vision, double vision, photophobia and pain.  Respiratory: Negative for cough and shortness of breath.   Cardiovascular: Negative for chest pain and palpitations.  Gastrointestinal: Negative for abdominal pain, blood in stool, constipation, diarrhea, heartburn, melena, nausea and vomiting.  Genitourinary: Negative for dysuria, flank pain, frequency, hematuria and urgency.  Musculoskeletal: Negative for falls.  Skin: Positive for rash (hair loss -- followed by Dermatology).  Neurological: Negative for dizziness, loss of consciousness and headaches.  Endo/Heme/Allergies: Negative for environmental allergies.  Psychiatric/Behavioral: Negative for depression, hallucinations, substance abuse and suicidal ideas. The patient is not nervous/anxious and does not have insomnia.    BP 118/84   Pulse 67   Temp 97.9 F (36.6 C) (Oral)   Resp 14   Ht '5\' 9"'$  (1.753 m)   Wt 212  lb (96.2 kg)   SpO2 99%   BMI 31.31 kg/m   Physical Exam  Constitutional: He is oriented to person, place, and time and well-developed, well-nourished, and in no distress.  HENT:  Head: Normocephalic and atraumatic.  Right Ear: External ear normal.  Left Ear: External ear normal.  Nose: Nose normal.  Mouth/Throat: Oropharynx is clear and moist. No oropharyngeal exudate.  Eyes: Conjunctivae and EOM are normal. Pupils are equal, round, and reactive to light.  Neck: Neck supple. No thyromegaly present.  Cardiovascular: Normal rate, regular rhythm, normal heart sounds and intact distal pulses.  Pulmonary/Chest: Effort  normal and breath sounds normal. No respiratory distress. He has no wheezes. He has no rales. He exhibits no tenderness.  Abdominal: Soft. Bowel sounds are normal. He exhibits no distension and no mass. There is no tenderness. There is no rebound and no guarding.  Genitourinary: Testes/scrotum normal and penis normal. No discharge found.  Lymphadenopathy:    He has no cervical adenopathy.  Neurological: He is alert and oriented to person, place, and time.  Skin: Skin is warm and dry. No rash noted.  Psychiatric: Affect normal.  Vitals reviewed.  Assessment/Plan: Visit for preventive health examination Depression screen negative. Health Maintenance reviewed -- immunizations up-to-date. Preventive schedule discussed and handout given in AVS. Will obtain fasting labs today.    Seasonal affective disorder Doing very well. Not requiring medication. Will monitor.  Alopecia Followed by Dermatology. Will check CBC, ESR, TSH, ANA.    Leeanne [name], PA-C

## 2017-03-22 ENCOUNTER — Other Ambulatory Visit: Payer: 59

## 2017-03-22 ENCOUNTER — Encounter: Payer: Self-pay | Admitting: Physician Assistant

## 2017-03-22 ENCOUNTER — Other Ambulatory Visit: Payer: Self-pay

## 2017-03-22 ENCOUNTER — Ambulatory Visit (INDEPENDENT_AMBULATORY_CARE_PROVIDER_SITE_OTHER): Payer: 59 | Admitting: Physician Assistant

## 2017-03-22 VITALS — BP 118/84 | HR 67 | Temp 97.9°F | Resp 14 | Ht 69.0 in | Wt 212.0 lb

## 2017-03-22 DIAGNOSIS — F338 Other recurrent depressive disorders: Secondary | ICD-10-CM | POA: Diagnosis not present

## 2017-03-22 DIAGNOSIS — Z0000 Encounter for general adult medical examination without abnormal findings: Secondary | ICD-10-CM | POA: Diagnosis not present

## 2017-03-22 DIAGNOSIS — L659 Nonscarring hair loss, unspecified: Secondary | ICD-10-CM | POA: Diagnosis not present

## 2017-03-22 LAB — CBC WITH DIFFERENTIAL/PLATELET
BASOS ABS: 0 10*3/uL (ref 0.0–0.1)
Basophils Relative: 0.8 % (ref 0.0–3.0)
Eosinophils Absolute: 0.1 10*3/uL (ref 0.0–0.7)
Eosinophils Relative: 2 % (ref 0.0–5.0)
HEMATOCRIT: 44 % (ref 39.0–52.0)
HEMOGLOBIN: 15.2 g/dL (ref 13.0–17.0)
LYMPHS PCT: 26.9 % (ref 12.0–46.0)
Lymphs Abs: 1.4 10*3/uL (ref 0.7–4.0)
MCHC: 34.7 g/dL (ref 30.0–36.0)
MCV: 100.7 fl — AB (ref 78.0–100.0)
MONOS PCT: 11.8 % (ref 3.0–12.0)
Monocytes Absolute: 0.6 10*3/uL (ref 0.1–1.0)
Neutro Abs: 3.2 10*3/uL (ref 1.4–7.7)
Neutrophils Relative %: 58.5 % (ref 43.0–77.0)
Platelets: 250 10*3/uL (ref 150.0–400.0)
RBC: 4.37 Mil/uL (ref 4.22–5.81)
RDW: 12.9 % (ref 11.5–15.5)
WBC: 5.4 10*3/uL (ref 4.0–10.5)

## 2017-03-22 LAB — LIPID PANEL
CHOL/HDL RATIO: 4
Cholesterol: 188 mg/dL (ref 0–200)
HDL: 48.6 mg/dL (ref >39.00)
LDL Cholesterol: 108 mg/dL — ABNORMAL HIGH (ref 0–99)
NONHDL: 139.32
Triglycerides: 155 mg/dL — ABNORMAL HIGH (ref 0.0–149.0)
VLDL: 31 mg/dL (ref 0.0–40.0)

## 2017-03-22 LAB — COMPREHENSIVE METABOLIC PANEL
ALK PHOS: 101 U/L (ref 39–117)
ALT: 64 U/L — AB (ref 0–53)
AST: 53 U/L — ABNORMAL HIGH (ref 0–37)
Albumin: 4.2 g/dL (ref 3.5–5.2)
BILIRUBIN TOTAL: 1.1 mg/dL (ref 0.2–1.2)
BUN: 13 mg/dL (ref 6–23)
CALCIUM: 9.5 mg/dL (ref 8.4–10.5)
CO2: 32 mEq/L (ref 19–32)
Chloride: 102 mEq/L (ref 96–112)
Creatinine, Ser: 0.82 mg/dL (ref 0.40–1.50)
GFR: 114.3 mL/min (ref >60.00)
Glucose, Bld: 100 mg/dL — ABNORMAL HIGH (ref 70–99)
Potassium: 3.7 mEq/L (ref 3.5–5.1)
Sodium: 140 mEq/L (ref 135–145)
TOTAL PROTEIN: 6.9 g/dL (ref 6.0–8.3)

## 2017-03-22 LAB — TSH: TSH: 1.65 u[IU]/mL (ref 0.35–4.50)

## 2017-03-22 LAB — SEDIMENTATION RATE: Sed Rate: 14 mm/hr (ref 0–15)

## 2017-03-22 NOTE — Assessment & Plan Note (Signed)
Depression screen negative. Health Maintenance reviewed -- immunizations up-to-date. Preventive schedule discussed and handout given in AVS. Will obtain fasting labs today.  

## 2017-03-22 NOTE — Assessment & Plan Note (Signed)
Followed by Dermatology. Will check CBC, ESR, TSH, ANA. 

## 2017-03-22 NOTE — Patient Instructions (Signed)
Please go to the lab for blood work.   Our office will call you with your results unless you have chosen to receive results via MyChart.  If your blood work is normal we will follow-up each year for physicals and as scheduled for chronic medical problems.  If anything is abnormal we will treat accordingly and get you in for a follow-up.  Please keep hydrated and eat a well-balanced diet. I recommend 150 minutes of moderate intensity aerobic exercise per week.  Happy Birthday!   Preventive Care 34-39 Years, Male Preventive care refers to lifestyle choices and visits with your health care provider that can promote health and wellness. What does preventive care include?  A yearly physical exam. This is also called an annual well check.  Dental exams once or twice a year.  Routine eye exams. Ask your health care provider how often you should have your eyes checked.  Personal lifestyle choices, including: ? Daily care of your teeth and gums. ? Regular physical activity. ? Eating a healthy diet. ? Avoiding tobacco and drug use. ? Limiting alcohol use. ? Practicing safe sex. What happens during an annual well check? The services and screenings done by your health care provider during your annual well check will depend on your age, overall health, lifestyle risk factors, and family history of disease. Counseling Your health care provider may ask you questions about your:  Alcohol use.  Tobacco use.  Drug use.  Emotional well-being.  Home and relationship well-being.  Sexual activity.  Eating habits.  Work and work Statistician.  Screening You may have the following tests or measurements:  Height, weight, and BMI.  Blood pressure.  Lipid and cholesterol levels. These may be checked every 5 years starting at age 34.  Diabetes screening. This is done by checking your blood sugar (glucose) after you have not eaten for a while (fasting).  Skin check.  Hepatitis C  blood test.  Hepatitis B blood test.  Sexually transmitted disease (STD) testing.  Discuss your test results, treatment options, and if necessary, the need for more tests with your health care provider. Vaccines Your health care provider may recommend certain vaccines, such as:  Influenza vaccine. This is recommended every year.  Tetanus, diphtheria, and acellular pertussis (Tdap, Td) vaccine. You may need a Td booster every 10 years.  Varicella vaccine. You may need this if you have not been vaccinated.  HPV vaccine. If you are 40 or younger, you may need three doses over 6 months.  Measles, mumps, and rubella (MMR) vaccine. You may need at least one dose of MMR.You may also need a second dose.  Pneumococcal 13-valent conjugate (PCV13) vaccine. You may need this if you have certain conditions and have not been vaccinated.  Pneumococcal polysaccharide (PPSV23) vaccine. You may need one or two doses if you smoke cigarettes or if you have certain conditions.  Meningococcal vaccine. One dose is recommended if you are age 66-21 years and a first-year college student living in a residence hall, or if you have one of several medical conditions. You may also need additional booster doses.  Hepatitis A vaccine. You may need this if you have certain conditions or if you travel or work in places where you may be exposed to hepatitis A.  Hepatitis B vaccine. You may need this if you have certain conditions or if you travel or work in places where you may be exposed to hepatitis B.  Haemophilus influenzae type b (Hib) vaccine. You may  need this if you have certain risk factors.  Talk to your health care provider about which screenings and vaccines you need and how often you need them. This information is not intended to replace advice given to you by your health care provider. Make sure you discuss any questions you have with your health care provider. Document Released: 04/14/2001 Document  Revised: 11/06/2015 Document Reviewed: 12/18/2014 Elsevier Interactive Patient Education  [employer].

## 2017-03-22 NOTE — Assessment & Plan Note (Signed)
Doing very well. Not requiring medication. Will monitor.

## 2017-03-23 ENCOUNTER — Other Ambulatory Visit (INDEPENDENT_AMBULATORY_CARE_PROVIDER_SITE_OTHER): Payer: 59

## 2017-03-23 DIAGNOSIS — R7309 Other abnormal glucose: Secondary | ICD-10-CM | POA: Diagnosis not present

## 2017-03-23 LAB — HEMOGLOBIN A1C: HEMOGLOBIN A1C: 5.3 % (ref 4.6–6.5)

## 2017-03-23 LAB — ANA: Anti Nuclear Antibody(ANA): NEGATIVE

## 2017-03-24 ENCOUNTER — Other Ambulatory Visit: Payer: Self-pay | Admitting: Physician Assistant

## 2017-03-24 DIAGNOSIS — R7989 Other specified abnormal findings of blood chemistry: Secondary | ICD-10-CM

## 2017-03-24 DIAGNOSIS — R945 Abnormal results of liver function studies: Secondary | ICD-10-CM

## 2017-04-22 ENCOUNTER — Telehealth: Payer: Self-pay | Admitting: Physician Assistant

## 2017-04-22 DIAGNOSIS — L659 Nonscarring hair loss, unspecified: Secondary | ICD-10-CM

## 2017-04-22 NOTE — Telephone Encounter (Signed)
Are you ok with second opinion referral for Dermatology?

## 2017-04-22 NOTE — Telephone Encounter (Signed)
Referral placed advised patient to expect a phone call to schedule.

## 2017-04-22 NOTE — Telephone Encounter (Signed)
Ok to place

## 2017-04-22 NOTE — Telephone Encounter (Signed)
Copied from CRM [phone]. Topic: Referral - Request >> Apr 22, 2017 11:52 AM Maia Pettiesrtiz, Kristie S wrote: Reason for CRM: pt requesting referral for dermatology - wanting a second opinion for hair loss treatment - he has been [facility] and would like to get a second opinion from Dr. Terri PiedraLupton at [facility]

## 2017-07-14 ENCOUNTER — Telehealth: Payer: Self-pay | Admitting: Physician Assistant

## 2017-07-14 NOTE — Telephone Encounter (Signed)
Received "Medical Clearance Form", via fax. Pt asks that provider sign the last page, in reference to physical on 03/22/17. Requests that once forms are complete that they be faxed back to the number on the cover sheet. Placed in front bin with charge sheet.

## 2017-07-14 NOTE — Telephone Encounter (Signed)
Paperwork in PCP folder for signature.

## 2017-07-14 NOTE — Telephone Encounter (Signed)
Pt states fax did not go through. Can forms be refaxed to [phone].

## 2017-07-14 NOTE — Telephone Encounter (Signed)
Forms completed. Given to CMA to fax.

## 2017-07-14 NOTE — Telephone Encounter (Signed)
Forms have been faxed again to number below.

## 2017-07-14 NOTE — Telephone Encounter (Signed)
Forms faxed to patient at given fax number

## 2017-07-16 ENCOUNTER — Telehealth: Payer: Self-pay | Admitting: Physician Assistant

## 2017-07-16 NOTE — Telephone Encounter (Signed)
Message from Windy Kalata, NT sent at 07/16/2017 8:44 AM EDT   Patient is calling and needs to know when his last flu shot was and where he had it done at.   Call History    Type Contact Phone User  07/16/2017 08:43 AM Phone (Incoming) [name], Joe (Self) [phone] Berneda Rose, NT

## 2017-07-16 NOTE — Telephone Encounter (Signed)
This encounter was created in error - please disregard.

## 2017-07-16 NOTE — Telephone Encounter (Signed)
Telephone  To pt.  To provide last flu shot was  11/30/16.  Pt. Voiced understanding.  Pt. Provided fax number where he wished info to be faxed.  Request info today to start a new job for Monday.  Pt. States unable to come to office to pick up.

## 2017-07-16 NOTE — Telephone Encounter (Signed)
Form has been faxed to the number given.

## 2017-10-07 ENCOUNTER — Other Ambulatory Visit: Payer: Self-pay | Admitting: Physician Assistant

## 2017-10-07 ENCOUNTER — Other Ambulatory Visit: Payer: Self-pay

## 2017-10-07 ENCOUNTER — Telehealth: Payer: Self-pay | Admitting: Physician Assistant

## 2017-10-07 DIAGNOSIS — L659 Nonscarring hair loss, unspecified: Secondary | ICD-10-CM

## 2017-10-07 NOTE — Telephone Encounter (Signed)
Please advise if this ok to place

## 2017-10-07 NOTE — Telephone Encounter (Signed)
Copied from CRM [phone]. Topic: Referral - Request >> Oct 07, 2017 10:27 AM Luanna Coleawoud, Jessica L wrote: Reason for CRM: pt calling to request a referral to a different dermatologist because he moved and the other office is to far away. [facility]. [phone] .

## 2017-10-07 NOTE — Telephone Encounter (Signed)
Ok to place referral.

## 2017-10-07 NOTE — Telephone Encounter (Signed)
Referral has been placed to [facility]. [phone] .

## 2018-01-25 ENCOUNTER — Other Ambulatory Visit: Payer: Self-pay

## 2018-01-25 ENCOUNTER — Ambulatory Visit
Admission: EM | Admit: 2018-01-25 | Discharge: 2018-01-25 | Disposition: A | Discharge status: Home / Self Care | Payer: No Typology Code available for payment source | Attending: Family Medicine | Admitting: Family Medicine

## 2018-01-25 DIAGNOSIS — J029 Acute pharyngitis, unspecified: Secondary | ICD-10-CM

## 2018-01-25 LAB — RAPID STREP SCREEN (MED CTR MEBANE ONLY): STREPTOCOCCUS, GROUP A SCREEN (DIRECT): NEGATIVE

## 2018-01-25 MED ORDER — AMOXICILLIN 875 MG PO TABS: 875.0000 mg | ORAL_TABLET | Freq: Two times a day (BID) | ORAL | 0 refills | Status: DC

## 2018-01-25 NOTE — Discharge Instructions (Addendum)
Take medication as prescribed. Rest. Drink plenty of fluids.  ° °Follow up with your primary care physician this week as needed. Return to Urgent care for new or worsening concerns.  ° °

## 2018-01-25 NOTE — ED Provider Notes (Signed)
[facility] ____________________________________________  Time seen: Approximately 6:15 PM  I have reviewed the triage vital signs and the nursing notes.   HISTORY  Chief Complaint Sore Throat   HPI Joe [name] is a 32 y.o. male presenting for evaluation of sore throat x2 days.  States sore throat has worsened today, initially just scratchy and now more like swallowing razor blades.  States no known fevers, but did have chills and flushed sensation earlier.  One episode of nausea and vomiting early this morning as well.  Did take some Delsym earlier, nothing just prior to arrival.  Denies any other nausea episodes or vomiting.  No diarrhea, abdominal pain.  Denies accompanying cough or congestion.  Patient states sore throat is currently moderate.  States he was exposed to strep throat this past weekend at a friendsgiving, by a child that had only been on antibiotic for 1 day.  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  Reports otherwise doing well denies other complaints.  Denies chest pain, shortness of breath, abdominal pain.  Denies recent sickness or recent antibiotic use.  Waldon MerlMartin, William C, PA-C :PCP  Past Medical History:  Diagnosis Date  . Anxiety   . Chicken pox   . Depression     Patient Active Problem List   Diagnosis Date Noted  . Alopecia 03/22/2017  . Irritable bowel syndrome with diarrhea 12/21/2014  . Visit for preventive health examination 03/27/2013  . Generalized anxiety disorder 03/27/2013  . Seasonal affective disorder (HCC) 03/27/2013  . Preventive measure 03/27/2013    Past Surgical History:  Procedure Laterality Date  . Unremarkable.       No current facility-administered medications for this encounter.   Current Outpatient Medications:  .  amoxicillin (AMOXIL) 875 MG tablet, Take 1 tablet (875 mg total) by mouth 2 (two) times daily., Disp: 20 tablet, Rfl: 0 .  XELJANZ 5 MG TABS, , Disp: , Rfl:    Allergies Skelaxin [metaxalone]  Family History  Adopted: Yes  Problem Relation Age of Onset  . Other Unknown        Adopted    Social History Social History   Tobacco Use  . Smoking status: Current Every Day Smoker    Packs/day: 0.25    Years: 10.00    Pack years: 2.50    Types: Cigarettes  . Smokeless tobacco: Never Used  Substance Use Topics  . Alcohol use: Yes    Alcohol/week: 13.0 standard drinks    Types: 5 Glasses of wine, 5 Cans of beer, 3 Standard drinks or equivalent per week  . Drug use: No    Review of Systems Constitutional: Positive chills.  Denies known fevers. ENT: positive sore throat. Cardiovascular: Denies chest pain. Respiratory: Denies shortness of breath. Gastrointestinal: No abdominal pain.  As above. No diarrhea.  Musculoskeletal: Negative for back pain. Skin: Negative for rash.  ____________________________________________   PHYSICAL EXAM:  VITAL SIGNS: ED Triage Vitals  Enc Vitals Group     BP 01/25/18 1725 (!) 140/91     Pulse Rate 01/25/18 1725 92     Resp 01/25/18 1725 18     Temp 01/25/18 1725 98.5 F (36.9 C)     Temp Source 01/25/18 1725 Oral     SpO2 01/25/18 1725 98 %     Weight 01/25/18 1723 210 lb (95.3 kg)     Height 01/25/18 1723 5\' 10"  (1.778 m)     Head Circumference --  Peak Flow --      Pain Score 01/25/18 1723 3     Pain Loc --      Pain Edu? --      Excl. in GC? --     Constitutional: Alert and oriented. Well appearing and in no acute distress. Eyes: Conjunctivae are normal.  Head: Atraumatic. No sinus tenderness to palpation. No swelling. No erythema.  Ears: no erythema, normal TMs bilaterally.   Nose:No nasal congestion  Mouth/Throat: Mucous membranes are moist.moderate pharyngeal erythema.  2+ bilateral tonsillar swelling.  No exudate. Neck: No stridor.  No cervical spine tenderness to palpation. Hematological/Lymphatic/Immunilogical: Anterior bilateral cervical lymphadenopathy. Cardiovascular:  Normal rate, regular rhythm. Grossly normal heart sounds.  Good peripheral circulation. Respiratory: Normal respiratory effort.  No retractions. No wheezes, rales or rhonchi. Good air movement.  Musculoskeletal: Ambulatory with steady gait. Neurologic:  Normal speech and language. No gait instability. Skin:  Skin appears warm, dry and intact. No rash noted. Psychiatric: Mood and affect are normal. Speech and behavior are normal.  ___________________________________________   LABS (all labs ordered are listed, but only abnormal results are displayed)  Labs Reviewed  RAPID STREP SCREEN ([facility] ONLY)  CULTURE, GROUP A STREP [facility])   ____________________________________________   PROCEDURES Procedures   INITIAL IMPRESSION / ASSESSMENT AND PLAN / ED COURSE  Pertinent labs & imaging results that were available during my care of the patient were reviewed by me and considered in my medical decision making (see chart for details).  Overall well-appearing patient.  No acute distress.  Strep negative, will culture.  However suspect streptococcal pharyngitis.  Will await culture and empirically start on oral amoxicillin.  Encourage rest, fluids, supportive care, over-the-counter Tylenol and ibuprofen as needed.Discussed indication, risks and benefits of medications with patient.  Work note given for today and tomorrow.  Discussed follow up with Primary care physician this week as needed. Discussed follow up and return parameters including no resolution or any worsening concerns. Patient verbalized understanding and agreed to plan.   ____________________________________________   FINAL CLINICAL IMPRESSION(S) / ED DIAGNOSES  Final diagnoses:  Pharyngitis, unspecified etiology     ED Discharge Orders         Ordered    amoxicillin (AMOXIL) 875 MG tablet  2 times daily     01/25/18 1815           Note: This dictation was prepared with Dragon dictation along with smaller  phrase technology. Any transcriptional errors that result from this process are unintentional.         Renford Dills, NP 01/25/18 1828

## 2018-01-25 NOTE — ED Triage Notes (Signed)
Patient complains of fever, sore throat, chills and body aches and lymph node swelling. States that he was exposed on Saturday while at a friendsgiving. States that symptoms started yesterday and have been worsening today.

## 2018-01-28 LAB — CULTURE, GROUP A STREP (THRC)

## 2018-01-29 ENCOUNTER — Telehealth (HOSPITAL_COMMUNITY): Payer: Self-pay | Admitting: Emergency Medicine

## 2018-01-29 NOTE — Telephone Encounter (Signed)
Culture is positive for non group A Strep germ.  This is a finding of uncertain significance; not the typical 'strep throat' germ.  [facility]. Attempted to reach patient. No answer at this time.

## 2018-11-28 ENCOUNTER — Telehealth: Payer: Self-pay

## 2018-11-28 NOTE — Telephone Encounter (Signed)
Patient has been scheduled for VV tomorrow afternono

## 2018-11-28 NOTE — Telephone Encounter (Signed)
Patient called in stating that he needed to make an appt with [facility] for possibly start medication for depression and anxiety. Requesting virtual visit. Patient last seen 1.21.19. Wanted an OK per Einar Pheasant that patient can have virtual visit since he has not been seen in almost 2 years.

## 2018-11-28 NOTE — Telephone Encounter (Signed)
That is fine with me. [place] believe which is why he is no longer listed as my patient. If he has been seen within 3 years no NP appt needed. Can do virtual visit for anxiety, etc.

## 2018-11-29 ENCOUNTER — Encounter: Payer: Self-pay | Admitting: Physician Assistant

## 2018-11-29 ENCOUNTER — Other Ambulatory Visit: Payer: Self-pay

## 2018-11-29 ENCOUNTER — Ambulatory Visit (INDEPENDENT_AMBULATORY_CARE_PROVIDER_SITE_OTHER): Payer: PRIVATE HEALTH INSURANCE | Admitting: Physician Assistant

## 2018-11-29 VITALS — HR 128 | Wt 193.6 lb

## 2018-11-29 DIAGNOSIS — F4320 Adjustment disorder, unspecified: Secondary | ICD-10-CM | POA: Diagnosis not present

## 2018-11-29 MED ORDER — ESCITALOPRAM OXALATE 10 MG PO TABS: 10.0000 mg | ORAL_TABLET | Freq: Every day | ORAL | 1 refills | Status: DC

## 2018-11-29 NOTE — Patient Instructions (Signed)
-   Please restart the Lexapro taking once daily as directed. - I want you to look into downloading the Calm, HeadSpace or [place] app to work on mindfulness training for anxiety.  - You have been through a lot and I want you to focus on not being so hard on yourself. None of this is your fault.  - I am researching counseling services in the [place] area. I will try to get back to you by tomorrow morning regarding recommendations. -Don't forget to call your insurance rep to see what outside labs they will allow you to have blood work drawn at. This way we can do some screening test and potentially start PrEP.   We will follow-up via video visit in 3-4 weeks, whenever works best for you.  Sooner if needed. Do not hesitate to message my nurse Patina or me via [place].   Hang in there!
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
## 2018-11-29 NOTE — Progress Notes (Signed)
I have discussed the procedure for the virtual visit with the patient who has given consent to proceed with assessment and treatment.   Summit Borchardt S Gina Leblond, CMA     

## 2019-04-03 ENCOUNTER — Telehealth: Payer: Self-pay | Admitting: *Deleted

## 2019-04-03 NOTE — Telephone Encounter (Signed)
Rx Lexapro 10mg  #30 last refill on 11/29/2018 Last OV 0-/29/2020 [facility] [place]

## 2019-04-03 NOTE — Telephone Encounter (Signed)
Needs follow-up for further refills. Should have been out in November/December if he was taking as directed.

## 2019-04-03 NOTE — Telephone Encounter (Signed)
Pt aware. F/U apt schedule

## 2019-04-05 ENCOUNTER — Encounter: Payer: Self-pay | Admitting: Physician Assistant

## 2019-04-05 ENCOUNTER — Other Ambulatory Visit: Payer: Self-pay

## 2019-04-05 ENCOUNTER — Ambulatory Visit (INDEPENDENT_AMBULATORY_CARE_PROVIDER_SITE_OTHER): Payer: PRIVATE HEALTH INSURANCE | Admitting: Physician Assistant

## 2019-04-05 DIAGNOSIS — F330 Major depressive disorder, recurrent, mild: Secondary | ICD-10-CM

## 2019-04-05 MED ORDER — SERTRALINE HCL 50 MG PO TABS: ORAL_TABLET | ORAL | 3 refills | Status: DC
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
## 2019-04-05 NOTE — Progress Notes (Signed)
I have discussed the procedure for the virtual visit with the patient who has given consent to proceed with assessment and treatment.   Yusuf Yu S [place], CMA     

## 2019-04-05 NOTE — Patient Instructions (Signed)
Instructions sent to MyChart  Please start the sertraline as directed. You will take a half a tablet daily for 2 weeks before increasing to 1 tablet daily. If you notice any recurrence of sexual side effects any worsening mood, etc, please let us know ASAP. Please follow-up with me via your MyChart in 4 weeks to let me know how things are going. I want you to follow-up with your new PCP in [place] as scheduled.  It was good talking to you today! Take care and stay safe!

## 2019-04-20 ENCOUNTER — Encounter: Payer: Self-pay | Admitting: Physician Assistant

## 2019-08-21 ENCOUNTER — Telehealth: Payer: PRIVATE HEALTH INSURANCE | Admitting: Physician Assistant

## 2019-08-21 ENCOUNTER — Other Ambulatory Visit: Payer: Self-pay

## 2019-08-23 ENCOUNTER — Encounter: Payer: Self-pay | Admitting: Physician Assistant

## 2019-08-23 ENCOUNTER — Telehealth (INDEPENDENT_AMBULATORY_CARE_PROVIDER_SITE_OTHER): Payer: PRIVATE HEALTH INSURANCE | Admitting: Physician Assistant

## 2019-08-23 ENCOUNTER — Other Ambulatory Visit: Payer: Self-pay

## 2019-08-23 DIAGNOSIS — F411 Generalized anxiety disorder: Secondary | ICD-10-CM

## 2019-08-23 MED ORDER — VENLAFAXINE HCL ER 37.5 MG PO CP24: 37.5000 mg | ORAL_CAPSULE | Freq: Every day | ORAL | 2 refills | Status: DC

## 2019-08-23 NOTE — Progress Notes (Signed)
   Virtual Visit via Video   I connected with patient on 08/23/19 at  8:00 AM EDT by a video enabled telemedicine application and verified that I am speaking with the correct person using two identifiers.  Location patient: Home Location provider: [facility] Persons participating in the virtual visit: Patient, Provider, CMA (Patina Moore)  I discussed the limitations of evaluation and management by telemedicine and the availability of in person appointments. The patient expressed understanding and agreed to proceed.  Subjective:   HPI:   Patient presents via [facility] today for follow-up regarding anxiety and depression.  Patient is currently on a regimen of sertraline 50 mg daily.  Endorses taking daily as directed.  Notes overall this has helped with his symptoms but he has recently been having a lot of breakthrough anxiety.  Notes he recently broke up with his significant other, but they are still having to live in the same dwelling as their lease is not up.  Notes this is caused some increased stress levels.  Anxiety has been affecting sleep.  Denies anorexia.  Denies excessive depressed mood with his Zoloft.  Denies suicidal thought or ideation.  Has a friend is on Effexor and would like to know if this is potentially an option for him.  ROS:   See pertinent positives and negatives per HPI.  Patient Active Problem List   Diagnosis Date Noted  . Alopecia 03/22/2017  . Irritable bowel syndrome with diarrhea 12/21/2014  . Visit for preventive health examination 03/27/2013  . Generalized anxiety disorder 03/27/2013    Social History   Tobacco Use  . Smoking status: Current Every Day Smoker    Packs/day: 0.25    Years: 10.00    Pack years: 2.50    Types: Cigarettes  . Smokeless tobacco: Never Used  Substance Use Topics  . Alcohol use: Yes    Alcohol/week: 13.0 standard drinks    Types: 5 Glasses of wine, 5 Cans of beer, 3 Standard drinks or equivalent per  week    Current Outpatient Medications:  .  XELJANZ 5 MG TABS, Take 1 tablet by mouth 2 (two) times daily. , Disp: , Rfl:  .  venlafaxine XR (EFFEXOR XR) 37.5 MG 24 hr capsule, Take 1 capsule (37.5 mg total) by mouth daily with breakfast., Disp: 30 capsule, Rfl: 2  Allergies  Allergen Reactions  . Skelaxin [Metaxalone] Swelling and Rash    Objective:   There were no vitals taken for this visit.  Patient is well-developed, well-nourished in no acute distress.  Resting comfortably at home.  Head is normocephalic, atraumatic.  No labored breathing.  Speech is clear and coherent with logical content.  Patient is alert and oriented at baseline.   Assessment and Plan:   1. Generalized anxiety disorder Discussed options including increasing dose of Zoloft, adding on an augmenting therapy like Wellbutrin or Rexulti or switching to a different SSRI or SNRI.  Patient really would like to try Effexor.  Do feel this could be beneficial for him and feel it is reasonable to switch.  We will have him stop Zoloft.  Rx Effexor XR 37.5 mg once daily.  Follow-up in 3 to 4 weeks for reassessment and titration of medication.  Follow-up sooner if needed.  Patient voiced understanding and agreement with the plan.    Piedad Climes, [place] 08/23/2019

## 2019-08-23 NOTE — Patient Instructions (Signed)
Instructions sent to MyChart

## 2019-09-20 ENCOUNTER — Telehealth (INDEPENDENT_AMBULATORY_CARE_PROVIDER_SITE_OTHER): Payer: PRIVATE HEALTH INSURANCE | Admitting: Physician Assistant

## 2019-09-20 ENCOUNTER — Encounter: Payer: Self-pay | Admitting: Physician Assistant

## 2019-09-20 ENCOUNTER — Other Ambulatory Visit: Payer: Self-pay

## 2019-09-20 DIAGNOSIS — F411 Generalized anxiety disorder: Secondary | ICD-10-CM

## 2019-09-20 MED ORDER — VENLAFAXINE HCL ER 37.5 MG PO CP24: 37.5000 mg | ORAL_CAPSULE | Freq: Every day | ORAL | 1 refills | Status: DC

## 2019-09-20 NOTE — Progress Notes (Signed)
I have discussed the procedure for the virtual visit with the patient who has given consent to proceed with assessment and treatment.   Skylor Schnapp S Gatlin Kittell, CMA     

## 2019-09-20 NOTE — Progress Notes (Signed)
   Virtual Visit via Video   I connected with patient on 09/20/19 at  8:00 AM EDT by a video enabled telemedicine application and verified that I am speaking with the correct person using two identifiers.  Location patient: Home Location provider: [facility] Persons participating in the virtual visit: Patient, Provider, CMA (Patina Moore)  I discussed the limitations of evaluation and management by telemedicine and the availability of in person appointments. The patient expressed understanding and agreed to proceed.  Subjective:   HPI:   Patient presents via [facility] today for follow-up of anxiety and depression. Patient was switched to Effexor XR 37.5 mg. Patient endorses taking medication daily as directed. Notes tolerating well overall. Did miss a dose which caused a headache. Still having some stressors but managing them much better. Notes anxiety much improved. Mood stable. Is resting well overall.    ROS:   See pertinent positives and negatives per HPI.  Patient Active Problem List   Diagnosis Date Noted  . Alopecia 03/22/2017  . Irritable bowel syndrome with diarrhea 12/21/2014  . Visit for preventive health examination 03/27/2013  . Generalized anxiety disorder 03/27/2013    Social History   Tobacco Use  . Smoking status: Current Every Day Smoker    Packs/day: 0.25    Years: 10.00    Pack years: 2.50    Types: Cigarettes  . Smokeless tobacco: Never Used  Substance Use Topics  . Alcohol use: Yes    Alcohol/week: 13.0 standard drinks    Types: 5 Glasses of wine, 5 Cans of beer, 3 Standard drinks or equivalent per week    Current Outpatient Medications:  .  venlafaxine XR (EFFEXOR XR) 37.5 MG 24 hr capsule, Take 1 capsule (37.5 mg total) by mouth daily with breakfast., Disp: 30 capsule, Rfl: 2 .  XELJANZ 5 MG TABS, Take 1 tablet by mouth 2 (two) times daily. , Disp: , Rfl:   Allergies  Allergen Reactions  . Skelaxin [Metaxalone] Swelling and  Rash    Objective:   There were no vitals taken for this visit.  Patient is well-developed, well-nourished in no acute distress.  Resting comfortably at home.  Head is normocephalic, atraumatic.  No labored breathing.  Speech is clear and coherent with logical content.  Patient is alert and oriented at baseline.   Assessment and Plan:   1. Generalized anxiety disorder Doing much better. Will continue current regimen. Refills sent. Plan on follow-up in 3 months. Sooner if needed.  - venlafaxine XR (EFFEXOR XR) 37.5 MG 24 hr capsule; Take 1 capsule (37.5 mg total) by mouth daily with breakfast.  Dispense: 90 capsule; Refill: 1   Piedad Climes, [place] 09/20/2019

## 2019-10-03 ENCOUNTER — Other Ambulatory Visit: Payer: Self-pay | Admitting: Emergency Medicine

## 2019-10-03 ENCOUNTER — Telehealth: Payer: Self-pay | Admitting: Physician Assistant

## 2019-10-03 DIAGNOSIS — F411 Generalized anxiety disorder: Secondary | ICD-10-CM

## 2019-10-03 MED ORDER — VENLAFAXINE HCL ER 37.5 MG PO CP24: 37.5000 mg | ORAL_CAPSULE | Freq: Every day | ORAL | 1 refills | Status: AC

## 2019-10-03 NOTE — Telephone Encounter (Signed)
Pt called in stating that the Effexor XR 90 day supply needs to be sent to the    [facility] [address] [place], [place] [address]  [phone] -  [phone]  Please advise  It was sent to the wrong pharmacy on 09/20/19

## 2019-10-03 NOTE — Telephone Encounter (Signed)
Effexor medication was sent to the [facility] pharmacy. Resent rx to the preferred pharmacy [facility].
